# Patient Record
Sex: Female | Born: 1965 | Race: White | Hispanic: No | Smoking: Current every day smoker
Health system: Southern US, Community
[De-identification: ages and names within clinical notes are randomized; demographics above are authoritative.]

## PROBLEM LIST (undated history)

## (undated) DIAGNOSIS — R0602 Shortness of breath: Secondary | ICD-10-CM

## (undated) DIAGNOSIS — J449 Chronic obstructive pulmonary disease, unspecified: Secondary | ICD-10-CM

## (undated) DIAGNOSIS — K7460 Unspecified cirrhosis of liver: Secondary | ICD-10-CM

## (undated) DIAGNOSIS — M797 Fibromyalgia: Secondary | ICD-10-CM

## (undated) DIAGNOSIS — Z9889 Other specified postprocedural states: Secondary | ICD-10-CM

## (undated) DIAGNOSIS — B1920 Unspecified viral hepatitis C without hepatic coma: Secondary | ICD-10-CM

## (undated) DIAGNOSIS — I10 Essential (primary) hypertension: Secondary | ICD-10-CM

## (undated) DIAGNOSIS — M1990 Unspecified osteoarthritis, unspecified site: Secondary | ICD-10-CM

## (undated) DIAGNOSIS — F319 Bipolar disorder, unspecified: Secondary | ICD-10-CM

## (undated) DIAGNOSIS — F419 Anxiety disorder, unspecified: Secondary | ICD-10-CM

## (undated) DIAGNOSIS — R51 Headache: Secondary | ICD-10-CM

## (undated) HISTORY — PX: KNEE ARTHROSCOPY: SHX127

## (undated) HISTORY — PX: TONSILLECTOMY: SUR1361

## (undated) HISTORY — PX: NECK SURGERY: SHX720

## (undated) HISTORY — PX: BACK SURGERY: SHX140

---

## 2002-06-05 ENCOUNTER — Encounter: Payer: Self-pay | Admitting: Neurosurgery

## 2002-06-05 ENCOUNTER — Encounter: Admission: RE | Admit: 2002-06-05 | Discharge: 2002-06-05 | Payer: Self-pay | Admitting: Neurosurgery

## 2002-06-18 ENCOUNTER — Encounter: Payer: Self-pay | Admitting: Neurosurgery

## 2002-06-18 ENCOUNTER — Encounter: Admission: RE | Admit: 2002-06-18 | Discharge: 2002-06-18 | Payer: Self-pay | Admitting: Neurosurgery

## 2002-06-20 ENCOUNTER — Emergency Department (HOSPITAL_COMMUNITY): Admission: EM | Admit: 2002-06-20 | Discharge: 2002-06-20 | Payer: Self-pay | Admitting: Emergency Medicine

## 2002-06-25 ENCOUNTER — Ambulatory Visit (HOSPITAL_COMMUNITY): Admission: RE | Admit: 2002-06-25 | Discharge: 2002-06-25 | Payer: Self-pay | Admitting: Neurosurgery

## 2002-06-25 ENCOUNTER — Encounter: Payer: Self-pay | Admitting: Neurosurgery

## 2003-01-15 ENCOUNTER — Encounter: Admission: RE | Admit: 2003-01-15 | Discharge: 2003-01-15 | Payer: Self-pay

## 2003-05-07 ENCOUNTER — Encounter
Admission: RE | Admit: 2003-05-07 | Discharge: 2003-08-05 | Payer: Self-pay | Admitting: Physical Medicine and Rehabilitation

## 2003-11-06 ENCOUNTER — Ambulatory Visit (HOSPITAL_COMMUNITY): Admission: RE | Admit: 2003-11-06 | Discharge: 2003-11-06 | Payer: Self-pay | Admitting: *Deleted

## 2003-11-06 ENCOUNTER — Encounter (INDEPENDENT_AMBULATORY_CARE_PROVIDER_SITE_OTHER): Payer: Self-pay | Admitting: Specialist

## 2004-05-04 ENCOUNTER — Encounter: Admission: RE | Admit: 2004-05-04 | Discharge: 2004-05-04 | Payer: Self-pay | Admitting: *Deleted

## 2004-05-05 ENCOUNTER — Ambulatory Visit (HOSPITAL_COMMUNITY): Admission: RE | Admit: 2004-05-05 | Discharge: 2004-05-05 | Payer: Self-pay | Admitting: *Deleted

## 2004-05-05 ENCOUNTER — Ambulatory Visit (HOSPITAL_BASED_OUTPATIENT_CLINIC_OR_DEPARTMENT_OTHER): Admission: RE | Admit: 2004-05-05 | Discharge: 2004-05-05 | Payer: Self-pay | Admitting: *Deleted

## 2004-07-31 ENCOUNTER — Emergency Department (HOSPITAL_COMMUNITY): Admission: EM | Admit: 2004-07-31 | Discharge: 2004-07-31 | Payer: Self-pay | Admitting: Family Medicine

## 2006-01-20 ENCOUNTER — Emergency Department (HOSPITAL_COMMUNITY): Admission: EM | Admit: 2006-01-20 | Discharge: 2006-01-20 | Payer: Self-pay | Admitting: Family Medicine

## 2008-03-28 ENCOUNTER — Encounter
Admission: RE | Admit: 2008-03-28 | Discharge: 2008-03-31 | Payer: Self-pay | Admitting: Physical Medicine & Rehabilitation

## 2008-03-31 ENCOUNTER — Ambulatory Visit: Payer: Self-pay | Admitting: Physical Medicine & Rehabilitation

## 2009-10-22 ENCOUNTER — Ambulatory Visit: Payer: Self-pay | Admitting: Gastroenterology

## 2009-11-18 ENCOUNTER — Ambulatory Visit (HOSPITAL_COMMUNITY): Admission: RE | Admit: 2009-11-18 | Discharge: 2009-11-18 | Payer: Self-pay | Admitting: Gastroenterology

## 2010-01-14 ENCOUNTER — Ambulatory Visit: Payer: Self-pay | Admitting: Gastroenterology

## 2010-02-05 ENCOUNTER — Ambulatory Visit (HOSPITAL_COMMUNITY): Admission: RE | Admit: 2010-02-05 | Discharge: 2010-02-05 | Payer: Self-pay | Admitting: Gastroenterology

## 2010-02-18 ENCOUNTER — Ambulatory Visit: Payer: Self-pay | Admitting: Gastroenterology

## 2010-04-10 ENCOUNTER — Encounter: Payer: Self-pay | Admitting: Gastroenterology

## 2010-06-28 ENCOUNTER — Other Ambulatory Visit: Payer: Self-pay | Admitting: *Deleted

## 2010-06-28 NOTE — Telephone Encounter (Signed)
Pt is asking for more than #10 Phenergan refilled..   Takes more with a migraine.

## 2010-06-28 NOTE — Telephone Encounter (Signed)
Wrong chart

## 2010-06-28 NOTE — Telephone Encounter (Signed)
Per previous notes, no record of this patient being seen in this clinic.  Do we have the correct patient.  Obviously, if we have not seen cannot call in her medication.
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
## 2010-08-06 NOTE — Op Note (Signed)
NAME:  Julia [name], Julia [name] NO.:  [id number]   MEDICAL RECORD NO.:  [id number]          PATIENT TYPE:  AMB   LOCATION:  DSC                          FACILITY:  [facility]   PHYSICIAN:  Tennis Must Meyerdierks, M.D.DATE OF BIRTH:  1965-06-30   DATE OF PROCEDURE:  DATE OF DISCHARGE:                                 OPERATIVE REPORT   PREOPERATIVE DIAGNOSIS:  Lacerated radial digital nerve, right index finger,  with possible neuroma formation.   POSTOPERATIVE DIAGNOSIS:  Scarring of radial digital nerve, right index  finger.   PROCEDURE:  Neurolysis radial digital nerve, right index finger, using the  microscope surgeon,   SURGEON:  Lowell Bouton, M.D.   ANESTHESIA:  General   OPERATIVE FINDINGS:  The patient had an intact radial digital nerve and  artery. There was significant scarring surrounding it with a large fundal of  scar tissue dorsal to the neurovascular bundle.   PROCEDURE:  Under general anesthesia with a tourniquet on the right arm. The  right hand was prepped and draped in the usual fashion.  After  exsanguinating the limb, the tourniquet was inflated to 250 mmHg. A radial  based Verner incision was made across the PIP joint on the right index  finger. Sharp dissection was carried through the subcutaneous tissues and  bleeding points were coagulated.   Blunt dissection was carried radially and the radial digital nerve was  identified in the subcutaneous tissues just ulnar to the scarring. The nerve  and artery appeared to be intact under loop magnification. The microscope  was then brought in and an external epineurolysis was performed of the nerve  from proximal to distal. It appeared to be completely intact. Microscope was  then removed from the field and sharp dissection was done dorsal to the  neurovascular bundle; excising scar tissue in the subcutaneous tissues. The  wound was then irrigated with saline. Half-percent Marcaine digital lock  was  inserted for pain control. The skin was closed with 4-0 nylon sutures.  Sterile dressings were applied and the tourniquet was released with good  circulation of the hand. The patient went to recovery room awake and stable  in good condition.      EMM/MEDQ  D:  05/05/2004  T:  05/05/2004  Job:  [id number]   cc:   Lonie Peak, P.A.  [place]

## 2011-04-18 ENCOUNTER — Encounter (HOSPITAL_COMMUNITY): Payer: Self-pay | Admitting: Pharmacy Technician

## 2011-04-18 ENCOUNTER — Other Ambulatory Visit: Payer: Self-pay | Admitting: Neurosurgery

## 2011-04-19 ENCOUNTER — Encounter (HOSPITAL_COMMUNITY): Payer: Self-pay | Admitting: *Deleted

## 2011-04-19 MED ORDER — CEFAZOLIN SODIUM-DEXTROSE 2-3 GM-% IV SOLR
2.0000 g | INTRAVENOUS | Status: DC
  Filled 2011-04-19: qty 50

## 2011-04-20 ENCOUNTER — Encounter (HOSPITAL_COMMUNITY): Payer: Self-pay | Admitting: Anesthesiology

## 2011-04-20 ENCOUNTER — Other Ambulatory Visit: Payer: Self-pay

## 2011-04-20 ENCOUNTER — Ambulatory Visit (HOSPITAL_COMMUNITY): Payer: Medicaid Other | Admitting: Anesthesiology

## 2011-04-20 ENCOUNTER — Encounter (HOSPITAL_COMMUNITY)
Admission: RE | Disposition: A | Discharge status: Home / Self Care | Payer: Self-pay | Source: Ambulatory Visit | Attending: Neurosurgery

## 2011-04-20 ENCOUNTER — Ambulatory Visit (HOSPITAL_COMMUNITY): Payer: Medicaid Other

## 2011-04-20 ENCOUNTER — Ambulatory Visit (HOSPITAL_COMMUNITY)
Admission: RE | Admit: 2011-04-20 | Discharge: 2011-04-22 | Disposition: A | Discharge status: Home / Self Care | Payer: Medicaid Other | Source: Ambulatory Visit | Attending: Neurosurgery | Admitting: Neurosurgery

## 2011-04-20 ENCOUNTER — Encounter (HOSPITAL_COMMUNITY): Payer: Self-pay | Admitting: *Deleted

## 2011-04-20 DIAGNOSIS — F411 Generalized anxiety disorder: Secondary | ICD-10-CM | POA: Insufficient documentation

## 2011-04-20 DIAGNOSIS — IMO0001 Reserved for inherently not codable concepts without codable children: Secondary | ICD-10-CM | POA: Insufficient documentation

## 2011-04-20 DIAGNOSIS — E119 Type 2 diabetes mellitus without complications: Secondary | ICD-10-CM | POA: Insufficient documentation

## 2011-04-20 DIAGNOSIS — M5126 Other intervertebral disc displacement, lumbar region: Secondary | ICD-10-CM | POA: Insufficient documentation

## 2011-04-20 DIAGNOSIS — M129 Arthropathy, unspecified: Secondary | ICD-10-CM | POA: Insufficient documentation

## 2011-04-20 DIAGNOSIS — J4489 Other specified chronic obstructive pulmonary disease: Secondary | ICD-10-CM | POA: Insufficient documentation

## 2011-04-20 DIAGNOSIS — I10 Essential (primary) hypertension: Secondary | ICD-10-CM | POA: Insufficient documentation

## 2011-04-20 DIAGNOSIS — M51379 Other intervertebral disc degeneration, lumbosacral region without mention of lumbar back pain or lower extremity pain: Secondary | ICD-10-CM | POA: Insufficient documentation

## 2011-04-20 DIAGNOSIS — M5137 Other intervertebral disc degeneration, lumbosacral region: Secondary | ICD-10-CM | POA: Insufficient documentation

## 2011-04-20 DIAGNOSIS — J449 Chronic obstructive pulmonary disease, unspecified: Secondary | ICD-10-CM | POA: Insufficient documentation

## 2011-04-20 DIAGNOSIS — M5116 Intervertebral disc disorders with radiculopathy, lumbar region: Secondary | ICD-10-CM

## 2011-04-20 DIAGNOSIS — F319 Bipolar disorder, unspecified: Secondary | ICD-10-CM | POA: Insufficient documentation

## 2011-04-20 DIAGNOSIS — K7460 Unspecified cirrhosis of liver: Secondary | ICD-10-CM | POA: Insufficient documentation

## 2011-04-20 HISTORY — DX: Shortness of breath: R06.02

## 2011-04-20 HISTORY — DX: Headache: R51

## 2011-04-20 HISTORY — DX: Anxiety disorder, unspecified: F41.9

## 2011-04-20 HISTORY — DX: Unspecified cirrhosis of liver: K74.60

## 2011-04-20 HISTORY — DX: Fibromyalgia: M79.7

## 2011-04-20 HISTORY — DX: Essential (primary) hypertension: I10

## 2011-04-20 HISTORY — DX: Unspecified osteoarthritis, unspecified site: M19.90

## 2011-04-20 HISTORY — PX: LUMBAR LAMINECTOMY/DECOMPRESSION MICRODISCECTOMY: SHX5026

## 2011-04-20 HISTORY — DX: Chronic obstructive pulmonary disease, unspecified: J44.9

## 2011-04-20 LAB — CBC
HCT: 43.5 % (ref 36.0–46.0)
Hemoglobin: 15 g/dL (ref 12.0–15.0)
WBC: 11.7 10*3/uL — ABNORMAL HIGH (ref 4.0–10.5)

## 2011-04-20 LAB — GLUCOSE, CAPILLARY
Glucose-Capillary: 80 mg/dL (ref 70–99)
Glucose-Capillary: 111 mg/dL — ABNORMAL HIGH (ref 70–99)

## 2011-04-20 LAB — COMPREHENSIVE METABOLIC PANEL
BUN: 11 mg/dL (ref 6–23)
Calcium: 9.9 mg/dL (ref 8.4–10.5)
Creatinine, Ser: 0.58 mg/dL (ref 0.50–1.10)
GFR calc Af Amer: >90 mL/min (ref >90)
Glucose, Bld: 56 mg/dL — ABNORMAL LOW (ref 70–99)
Total Protein: 6.6 g/dL (ref 6.0–8.3)

## 2011-04-20 LAB — SURGICAL PCR SCREEN
MRSA, PCR: NEGATIVE
Staphylococcus aureus: NEGATIVE

## 2011-04-20 SURGERY — LUMBAR LAMINECTOMY/DECOMPRESSION MICRODISCECTOMY
Anesthesia: General | Site: Back | Laterality: Right | Wound class: Clean

## 2011-04-20 MED ORDER — RISPERIDONE 1 MG PO TABS: 1.0000 mg | ORAL_TABLET | Freq: Two times a day (BID) | ORAL | Status: DC

## 2011-04-20 MED ORDER — RISPERIDONE 1 MG PO TABS
1.0000 mg | ORAL_TABLET | ORAL | Status: DC
  Administered 2011-04-21 – 2011-04-22 (×2): 1 mg via ORAL
  Filled 2011-04-20 (×3): qty 1

## 2011-04-20 MED ORDER — RISPERIDONE 2 MG PO TABS
2.0000 mg | ORAL_TABLET | Freq: Every evening | ORAL | Status: DC
  Administered 2011-04-20 – 2011-04-21 (×2): 2 mg via ORAL
  Filled 2011-04-20 (×3): qty 1

## 2011-04-20 MED ORDER — FENTANYL CITRATE 0.05 MG/ML IJ SOLN
INTRAMUSCULAR | Status: DC | PRN
  Administered 2011-04-20: 100 ug via INTRAVENOUS

## 2011-04-20 MED ORDER — HEMOSTATIC AGENTS (NO CHARGE) OPTIME
TOPICAL | Status: DC | PRN
  Administered 2011-04-20: 1 via TOPICAL

## 2011-04-20 MED ORDER — INSULIN ASPART PROT & ASPART (70-30 MIX) 100 UNIT/ML ~~LOC~~ SUSP
50.0000 [IU] | Freq: Every day | SUBCUTANEOUS | Status: DC
  Administered 2011-04-21 – 2011-04-22 (×2): 50 [IU] via SUBCUTANEOUS
  Filled 2011-04-20: qty 3

## 2011-04-20 MED ORDER — HYDROMORPHONE HCL PF 1 MG/ML IJ SOLN
INTRAMUSCULAR | Status: AC
  Filled 2011-04-20: qty 1

## 2011-04-20 MED ORDER — LIDOCAINE HCL (CARDIAC) 20 MG/ML IV SOLN
INTRAVENOUS | Status: DC | PRN
  Administered 2011-04-20: 80 mg via INTRAVENOUS

## 2011-04-20 MED ORDER — DOCUSATE SODIUM 100 MG PO CAPS
100.0000 mg | ORAL_CAPSULE | Freq: Two times a day (BID) | ORAL | Status: DC
  Administered 2011-04-20 – 2011-04-22 (×4): 100 mg via ORAL
  Filled 2011-04-20 (×4): qty 1

## 2011-04-20 MED ORDER — CEFAZOLIN SODIUM 1-5 GM-% IV SOLN
1.0000 g | Freq: Three times a day (TID) | INTRAVENOUS | Status: AC
  Administered 2011-04-20 – 2011-04-21 (×2): 1 g via INTRAVENOUS
  Filled 2011-04-20 (×2): qty 50

## 2011-04-20 MED ORDER — PHENYLEPHRINE HCL 10 MG/ML IJ SOLN
INTRAMUSCULAR | Status: DC | PRN
  Administered 2011-04-20 (×4): 80 ug via INTRAVENOUS

## 2011-04-20 MED ORDER — HYDROMORPHONE HCL PF 1 MG/ML IJ SOLN
0.2500 mg | INTRAMUSCULAR | Status: DC | PRN
  Administered 2011-04-20 (×4): 0.5 mg via INTRAVENOUS

## 2011-04-20 MED ORDER — THROMBIN 5000 UNITS EX SOLR
CUTANEOUS | Status: DC | PRN
  Administered 2011-04-20 (×2): 5000 [IU] via TOPICAL

## 2011-04-20 MED ORDER — 0.9 % SODIUM CHLORIDE (POUR BTL) OPTIME
TOPICAL | Status: DC | PRN
  Administered 2011-04-20: 1000 mL

## 2011-04-20 MED ORDER — MENTHOL 3 MG MT LOZG: 1.0000 | LOZENGE | OROMUCOSAL | Status: DC | PRN

## 2011-04-20 MED ORDER — LISINOPRIL 10 MG PO TABS
10.0000 mg | ORAL_TABLET | Freq: Every day | ORAL | Status: DC
  Administered 2011-04-21 – 2011-04-22 (×2): 10 mg via ORAL
  Filled 2011-04-20 (×3): qty 1

## 2011-04-20 MED ORDER — LACTATED RINGERS IV SOLN
INTRAVENOUS | Status: DC | PRN
  Administered 2011-04-20: 17:00:00 via INTRAVENOUS

## 2011-04-20 MED ORDER — METHYLPREDNISOLONE ACETATE 80 MG/ML IJ SUSP
INTRAMUSCULAR | Status: DC | PRN
  Administered 2011-04-20: 80 mg

## 2011-04-20 MED ORDER — ONDANSETRON HCL 4 MG/2ML IJ SOLN: 4.0000 mg | Freq: Four times a day (QID) | INTRAMUSCULAR | Status: DC | PRN

## 2011-04-20 MED ORDER — HYDROCHLOROTHIAZIDE 12.5 MG PO CAPS
12.5000 mg | ORAL_CAPSULE | Freq: Every day | ORAL | Status: DC
  Administered 2011-04-21 – 2011-04-22 (×2): 12.5 mg via ORAL
  Filled 2011-04-20 (×3): qty 1

## 2011-04-20 MED ORDER — GLYCOPYRROLATE 0.2 MG/ML IJ SOLN
INTRAMUSCULAR | Status: DC | PRN
  Administered 2011-04-20: .6 mg via INTRAVENOUS

## 2011-04-20 MED ORDER — MUPIROCIN 2 % EX OINT
TOPICAL_OINTMENT | CUTANEOUS | Status: AC
  Filled 2011-04-20: qty 22

## 2011-04-20 MED ORDER — ZOLPIDEM TARTRATE 10 MG PO TABS
10.0000 mg | ORAL_TABLET | Freq: Every evening | ORAL | Status: DC | PRN
  Administered 2011-04-20 – 2011-04-21 (×2): 10 mg via ORAL
  Filled 2011-04-20 (×2): qty 1

## 2011-04-20 MED ORDER — SODIUM CHLORIDE 0.9 % IJ SOLN: 3.0000 mL | INTRAMUSCULAR | Status: DC | PRN

## 2011-04-20 MED ORDER — TIOTROPIUM BROMIDE MONOHYDRATE 18 MCG IN CAPS
18.0000 ug | ORAL_CAPSULE | Freq: Every day | RESPIRATORY_TRACT | Status: DC
  Administered 2011-04-21 – 2011-04-22 (×2): 18 ug via RESPIRATORY_TRACT
  Filled 2011-04-20: qty 5

## 2011-04-20 MED ORDER — MORPHINE SULFATE 4 MG/ML IJ SOLN
4.0000 mg | INTRAMUSCULAR | Status: DC | PRN
  Administered 2011-04-20 – 2011-04-22 (×7): 4 mg via INTRAVENOUS
  Filled 2011-04-20 (×7): qty 1

## 2011-04-20 MED ORDER — ROCURONIUM BROMIDE 100 MG/10ML IV SOLN
INTRAVENOUS | Status: DC | PRN
  Administered 2011-04-20: 50 mg via INTRAVENOUS

## 2011-04-20 MED ORDER — FENTANYL CITRATE 0.05 MG/ML IJ SOLN
INTRAMUSCULAR | Status: AC
  Filled 2011-04-20: qty 2

## 2011-04-20 MED ORDER — SODIUM CHLORIDE 0.9 % IV SOLN
INTRAVENOUS | Status: DC | PRN
  Administered 2011-04-20: 16:00:00 via INTRAVENOUS

## 2011-04-20 MED ORDER — PHENOL 1.4 % MT LIQD: 1.0000 | OROMUCOSAL | Status: DC | PRN

## 2011-04-20 MED ORDER — ONDANSETRON HCL 4 MG/2ML IJ SOLN
INTRAMUSCULAR | Status: DC | PRN
  Administered 2011-04-20: 4 mg via INTRAVENOUS

## 2011-04-20 MED ORDER — OXYCODONE-ACETAMINOPHEN 5-325 MG PO TABS
1.0000 | ORAL_TABLET | ORAL | Status: DC | PRN
  Administered 2011-04-20 – 2011-04-22 (×9): 2 via ORAL
  Filled 2011-04-20 (×9): qty 2

## 2011-04-20 MED ORDER — SODIUM CHLORIDE 0.9 % IJ SOLN
3.0000 mL | Freq: Two times a day (BID) | INTRAMUSCULAR | Status: DC
  Administered 2011-04-21 (×2): 3 mL via INTRAVENOUS

## 2011-04-20 MED ORDER — AMLODIPINE BESYLATE 10 MG PO TABS
10.0000 mg | ORAL_TABLET | Freq: Every day | ORAL | Status: DC
  Administered 2011-04-21 – 2011-04-22 (×2): 10 mg via ORAL
  Filled 2011-04-20 (×3): qty 1

## 2011-04-20 MED ORDER — SODIUM CHLORIDE 0.9 % IV SOLN: 250.0000 mL | INTRAVENOUS | Status: DC

## 2011-04-20 MED ORDER — SODIUM CHLORIDE 0.9 % IV SOLN
INTRAVENOUS | Status: DC
  Administered 2011-04-20 (×2): via INTRAVENOUS

## 2011-04-20 MED ORDER — PROPOFOL 10 MG/ML IV EMUL
INTRAVENOUS | Status: DC | PRN
  Administered 2011-04-20: 170 mg via INTRAVENOUS

## 2011-04-20 MED ORDER — INSULIN NPH ISOPHANE & REGULAR (70-30) 100 UNIT/ML ~~LOC~~ SUSP: 30.0000 [IU] | Freq: Two times a day (BID) | SUBCUTANEOUS | Status: DC

## 2011-04-20 MED ORDER — BUPIVACAINE-EPINEPHRINE PF 0.5-1:200000 % IJ SOLN
INTRAMUSCULAR | Status: DC | PRN
  Administered 2011-04-20: 20 mL

## 2011-04-20 MED ORDER — EPHEDRINE SULFATE 50 MG/ML IJ SOLN
INTRAMUSCULAR | Status: DC | PRN
  Administered 2011-04-20: 15 mg via INTRAVENOUS
  Administered 2011-04-20 (×2): 5 mg via INTRAVENOUS
  Administered 2011-04-20 (×2): 10 mg via INTRAVENOUS

## 2011-04-20 MED ORDER — DEXTROSE 50 % IV SOLN
INTRAVENOUS | Status: AC
  Administered 2011-04-20: 25 mL via INTRAVENOUS
  Filled 2011-04-20: qty 50

## 2011-04-20 MED ORDER — ONDANSETRON HCL 4 MG/2ML IJ SOLN
4.0000 mg | INTRAMUSCULAR | Status: DC | PRN
Start: 2011-04-20 — End: 2011-04-22

## 2011-04-20 MED ORDER — ACETAMINOPHEN 650 MG RE SUPP: 650.0000 mg | RECTAL | Status: DC | PRN

## 2011-04-20 MED ORDER — DEXTROSE 50 % IV SOLN
INTRAVENOUS | Status: DC | PRN
  Administered 2011-04-20: 12.5 g via INTRAVENOUS

## 2011-04-20 MED ORDER — INSULIN ASPART PROT & ASPART (70-30 MIX) 100 UNIT/ML ~~LOC~~ SUSP
30.0000 [IU] | Freq: Every day | SUBCUTANEOUS | Status: DC
  Administered 2011-04-21: 30 [IU] via SUBCUTANEOUS
  Filled 2011-04-20: qty 3

## 2011-04-20 MED ORDER — NEOSTIGMINE METHYLSULFATE 1 MG/ML IJ SOLN
INTRAMUSCULAR | Status: DC | PRN
  Administered 2011-04-20: 4 mg via INTRAVENOUS

## 2011-04-20 MED ORDER — LISINOPRIL-HYDROCHLOROTHIAZIDE 10-12.5 MG PO TABS: 1.0000 | ORAL_TABLET | Freq: Every day | ORAL | Status: DC

## 2011-04-20 MED ORDER — ACETAMINOPHEN 325 MG PO TABS: 650.0000 mg | ORAL_TABLET | ORAL | Status: DC | PRN

## 2011-04-20 SURGICAL SUPPLY — 59 items
APL SKNCLS STERI-STRIP NONHPOA (GAUZE/BANDAGES/DRESSINGS) ×1
BENZOIN TINCTURE PRP APPL 2/3 (GAUZE/BANDAGES/DRESSINGS) ×2 IMPLANT
BLADE SURG ROTATE 9660 (MISCELLANEOUS) IMPLANT
BUR ACORN 6.0 (BURR) IMPLANT
BUR MATCHSTICK NEURO 3.0 LAGG (BURR) ×2 IMPLANT
CANISTER SUCTION 2500CC (MISCELLANEOUS) ×2 IMPLANT
CLOSURE STERI STRIP 1/2 X4 (GAUZE/BANDAGES/DRESSINGS) ×1 IMPLANT
CLOTH BEACON ORANGE TIMEOUT ST (SAFETY) ×2 IMPLANT
CONT SPEC 4OZ CLIKSEAL STRL BL (MISCELLANEOUS) ×2 IMPLANT
DRAPE LAPAROTOMY 100X72X124 (DRAPES) ×2 IMPLANT
DRAPE MICROSCOPE LEICA (MISCELLANEOUS) ×2 IMPLANT
DRAPE POUCH INSTRU U-SHP 10X18 (DRAPES) ×2 IMPLANT
DRSG PAD ABDOMINAL 8X10 ST (GAUZE/BANDAGES/DRESSINGS) IMPLANT
DURAPREP 26ML APPLICATOR (WOUND CARE) ×2 IMPLANT
ELECT REM PT RETURN 9FT ADLT (ELECTROSURGICAL) ×2
ELECTRODE REM PT RTRN 9FT ADLT (ELECTROSURGICAL) ×1 IMPLANT
GAUZE SPONGE 4X4 16PLY XRAY LF (GAUZE/BANDAGES/DRESSINGS) IMPLANT
GLOVE BIOGEL M 8.0 STRL (GLOVE) ×2 IMPLANT
GLOVE ECLIPSE 7.5 STRL STRAW (GLOVE) ×2 IMPLANT
GLOVE EXAM NITRILE LRG STRL (GLOVE) IMPLANT
GLOVE EXAM NITRILE MD LF STRL (GLOVE) IMPLANT
GLOVE EXAM NITRILE XL STR (GLOVE) IMPLANT
GLOVE EXAM NITRILE XS STR PU (GLOVE) IMPLANT
GLOVE INDICATOR 7.0 STRL GRN (GLOVE) ×1 IMPLANT
GLOVE INDICATOR 7.5 STRL GRN (GLOVE) ×1 IMPLANT
GLOVE OPTIFIT SS 6.5 STRL BRWN (GLOVE) ×1 IMPLANT
GOWN BRE IMP SLV AUR LG STRL (GOWN DISPOSABLE) ×2 IMPLANT
GOWN BRE IMP SLV AUR XL STRL (GOWN DISPOSABLE) ×2 IMPLANT
GOWN STRL REIN 2XL LVL4 (GOWN DISPOSABLE) ×2 IMPLANT
KIT BASIN OR (CUSTOM PROCEDURE TRAY) ×2 IMPLANT
KIT ROOM TURNOVER OR (KITS) ×2 IMPLANT
NDL HYPO 18GX1.5 BLUNT FILL (NEEDLE) IMPLANT
NDL HYPO 21X1.5 SAFETY (NEEDLE) IMPLANT
NDL HYPO 25X1 1.5 SAFETY (NEEDLE) IMPLANT
NDL SPNL 20GX3.5 QUINCKE YW (NEEDLE) IMPLANT
NEEDLE HYPO 18GX1.5 BLUNT FILL (NEEDLE) IMPLANT
NEEDLE HYPO 21X1.5 SAFETY (NEEDLE) IMPLANT
NEEDLE HYPO 25X1 1.5 SAFETY (NEEDLE) IMPLANT
NEEDLE SPNL 20GX3.5 QUINCKE YW (NEEDLE) IMPLANT
NS IRRIG 1000ML POUR BTL (IV SOLUTION) ×2 IMPLANT
PACK LAMINECTOMY NEURO (CUSTOM PROCEDURE TRAY) ×2 IMPLANT
PAD ARMBOARD 7.5X6 YLW CONV (MISCELLANEOUS) ×6 IMPLANT
PATTIES SURGICAL .5 X1 (DISPOSABLE) ×2 IMPLANT
RUBBERBAND STERILE (MISCELLANEOUS) ×4 IMPLANT
SPONGE GAUZE 4X4 12PLY (GAUZE/BANDAGES/DRESSINGS) ×2 IMPLANT
SPONGE LAP 4X18 X RAY DECT (DISPOSABLE) IMPLANT
SPONGE SURGIFOAM ABS GEL SZ50 (HEMOSTASIS) ×2 IMPLANT
STRIP CLOSURE SKIN 1/2X4 (GAUZE/BANDAGES/DRESSINGS) ×2 IMPLANT
SUT VIC AB 0 CT1 18XCR BRD8 (SUTURE) ×1 IMPLANT
SUT VIC AB 0 CT1 8-18 (SUTURE) ×2
SUT VIC AB 2-0 CP2 18 (SUTURE) ×2 IMPLANT
SUT VIC AB 3-0 SH 8-18 (SUTURE) ×2 IMPLANT
SYR 20CC LL (SYRINGE) IMPLANT
SYR 20ML ECCENTRIC (SYRINGE) ×2 IMPLANT
SYR 5ML LL (SYRINGE) IMPLANT
TAPE CLOTH SURG 4X10 WHT LF (GAUZE/BANDAGES/DRESSINGS) ×1 IMPLANT
TOWEL OR 17X24 6PK STRL BLUE (TOWEL DISPOSABLE) ×2 IMPLANT
TOWEL OR 17X26 10 PK STRL BLUE (TOWEL DISPOSABLE) ×2 IMPLANT
WATER STERILE IRR 1000ML POUR (IV SOLUTION) ×2 IMPLANT

## 2011-04-20 NOTE — Progress Notes (Signed)
Op note [id number]

## 2011-04-20 NOTE — Progress Notes (Signed)
Right l4 5 discectomy

## 2011-04-20 NOTE — Anesthesia Postprocedure Evaluation (Signed)
  Anesthesia Post-op Note  Patient: Julia [name]  Procedure(s) Performed:  LUMBAR LAMINECTOMY/DECOMPRESSION MICRODISCECTOMY - Right Lumbar four- five Diskectomy  Patient Location: PACU  Anesthesia Type: General  Level of Consciousness: awake, alert  and oriented  Airway and Oxygen Therapy: Patient Spontanous Breathing and Patient connected to nasal cannula oxygen  Post-op Pain: mild  Post-op Assessment: Post-op Vital signs reviewed, Patient's Cardiovascular Status Stable, Respiratory Function Stable, Patent Airway, No signs of Nausea or vomiting and Pain level controlled  Post-op Vital Signs: Reviewed and stable  Complications: No apparent anesthesia complications

## 2011-04-20 NOTE — Preoperative (Signed)
Beta Blockers   Reason not to administer Beta Blockers:Not Applicable 

## 2011-04-20 NOTE — H&P (Signed)
Julia [name] is an 46 y.o. female.   Chief Complaint: right leg pain HPI: 7 months history of lbp with right leg pain,Also, . Some neck and thoracic pain. She has failed with conservative  treatment. Send to [place] by neurologist.  Past Medical History  Diagnosis Date  . Hypertension   . Diabetes mellitus   . COPD (chronic obstructive pulmonary disease)   . Asthma   . Shortness of breath   . Headache   . Arthritis   . Bipolar affective disorder   . Fibromyalgia   . Cirrhosis of liver   . Anxiety     Past Surgical History  Procedure Date  . Neck surgery   . Cesarean section   . Tonsillectomy   . Back surgery     x2  . Knee arthroscopy     L    History reviewed. No pertinent family history. Social History:  reports that she has been smoking.  She does not have any smokeless tobacco history on file. She reports that she does not drink alcohol or use illicit drugs.  Allergies:  Allergies  Allergen Reactions  . Codeine Hives  . Gabapentin Rash    Medications Prior to Admission  Medication Dose Route Frequency Provider Last Rate Last Dose  . ceFAZolin (ANCEF) IVPB 2 g/50 mL premix  2 g Intravenous 60 min Pre-Op Karn Cassis, MD      . dextrose 50 % solution        25 mL at 04/20/11 1358  . mupirocin ointment (BACTROBAN) 2 %            No current outpatient prescriptions on file as of 04/20/2011.    Results for orders placed during the hospital encounter of 04/20/11 (from the past 48 hour(s))  GLUCOSE, CAPILLARY     Status: Normal   Collection Time   04/20/11 12:39 PM      Component Value Range Comment   Glucose-Capillary 80  70 - 99 (mg/dL)   COMPREHENSIVE METABOLIC PANEL     Status: Abnormal   Collection Time   04/20/11  1:15 PM      Component Value Range Comment   Sodium 136  135 - 145 (mEq/L)    Potassium 3.3 (*) 3.5 - 5.1 (mEq/L)    Chloride 99  96 - 112 (mEq/L)    CO2 28  19 - 32 (mEq/L)    Glucose, Bld 56 (*) 70 - 99 (mg/dL)    BUN 11  6 - 23 (mg/dL)    Creatinine, Ser 0.86  0.50 - 1.10 (mg/dL)    Calcium 9.9  8.4 - 10.5 (mg/dL)    Total Protein 6.6  6.0 - 8.3 (g/dL)    Albumin 3.6  3.5 - 5.2 (g/dL)    AST 46 (*) 0 - 37 (U/L)    ALT 42 (*) 0 - 35 (U/L)    Alkaline Phosphatase 55  39 - 117 (U/L)    Total Bilirubin 0.6  0.3 - 1.2 (mg/dL)    GFR calc non Af Amer >90  >90 (mL/min)    GFR calc Af Amer >90  >90 (mL/min)   CBC     Status: Abnormal   Collection Time   04/20/11  1:15 PM      Component Value Range Comment   WBC 11.7 (*) 4.0 - 10.5 (K/uL)    RBC 4.69  3.87 - 5.11 (MIL/uL)    Hemoglobin 15.0  12.0 - 15.0 (g/dL)    HCT  43.5  36.0 - 46.0 (%)    MCV 92.8  78.0 - 100.0 (fL)    MCH 32.0  26.0 - 34.0 (pg)    MCHC 34.5  30.0 - 36.0 (g/dL)    RDW 45.4  09.8 - 11.9 (%)    Platelets 122 (*) 150 - 400 (K/uL)   HCG, SERUM, QUALITATIVE     Status: Normal   Collection Time   04/20/11  1:15 PM      Component Value Range Comment   Preg, Serum NEGATIVE  NEGATIVE    GLUCOSE, CAPILLARY     Status: Abnormal   Collection Time   04/20/11  1:32 PM      Component Value Range Comment   Glucose-Capillary 60 (*) 70 - 99 (mg/dL)    Comment 1 Notify RN     GLUCOSE, CAPILLARY     Status: Abnormal   Collection Time   04/20/11  2:15 PM      Component Value Range Comment   Glucose-Capillary 111 (*) 70 - 99 (mg/dL)    Dg Chest 2 View  1/47/8295  *RADIOLOGY REPORT*  Clinical Data: Preoperative evaluation for lumbar disc surgery, hypertension, diabetes  CHEST - 2 VIEW  Comparison:  02/05/2010  Findings:  The heart size and mediastinal contours are within normal limits.  Both lungs are clear.  The visualized skeletal structures are unremarkable.  IMPRESSION: No active cardiopulmonary disease.  Original Report Authenticated By: Judie Petit. Ruel Favors, M.D.    Review of Systems  HENT: Negative.   Eyes: Negative.   Respiratory:       Emphysema  Cardiovascular: Negative.   Gastrointestinal: Negative.        Liver disease  Genitourinary: Negative.         Arterial hypertension  Musculoskeletal: Positive for back pain.  Skin: Negative.   Neurological: Negative.   Endo/Heme/Allergies: Negative.   Psychiatric/Behavioral: Positive for depression.    Blood pressure 105/79, pulse 103, temperature 97.9 F (36.6 C), temperature source Oral, resp. rate 20, height 5\' 5"  (1.651 m), weight 98.431 kg (217 lb), last menstrual period 04/18/2011, SpO2 95.00%. Physical Exam hent nl. Neck ,flexible. Cv, nl. Lungs mild ronchii bilaterally. Abdomen, nl. Extremities, grade 1 edema. NEUROweakness of df right foot. Sensory,nl. slr positive in right at 30 degrees.scar lumbar area from previous surgery x 2. Mri ddd. Large right hnp at right l4 5.  Assessment/Plan right l4 5 discectomy.   Keldon Lassen M 04/20/2011, 3:17 PM

## 2011-04-20 NOTE — Anesthesia Procedure Notes (Signed)
Procedure Name: Intubation Date/Time: 04/20/2011 4:12 PM Performed by: Shirlyn Goltz, TOM Pre-anesthesia Checklist: Patient identified, Emergency Drugs available, Suction available, Patient being monitored and Timeout performed Patient Re-evaluated:Patient Re-evaluated prior to inductionOxygen Delivery Method: Circle System Utilized Preoxygenation: Pre-oxygenation with 100% oxygen Intubation Type: IV induction Ventilation: Mask ventilation without difficulty and Oral airway inserted - appropriate to patient size Laryngoscope Size: Mac and 4 Grade View: Grade I Tube type: Oral Tube size: 7.5 mm Number of attempts: 1 Airway Equipment and Method: stylet Placement Confirmation: ETT inserted through vocal cords under direct vision,  positive ETCO2 and breath sounds checked- equal and bilateral Secured at: 21 cm Tube secured with: Tape Dental Injury: Teeth and Oropharynx as per pre-operative assessment

## 2011-04-20 NOTE — Anesthesia Preprocedure Evaluation (Addendum)
Anesthesia Evaluation  Patient identified by MRN, date of birth, ID band Patient awake    Reviewed: Allergy & Precautions, H&P , NPO status , Patient's Chart, lab work & pertinent test results  Airway Mallampati: II TM Distance: >3 FB Neck ROM: full    Dental  (+) Edentulous Upper and Edentulous Lower   Pulmonary shortness of breath, asthma , COPD COPD inhaler, Current Smoker,          Cardiovascular hypertension, Pt. on medications     Neuro/Psych  Headaches, PSYCHIATRIC DISORDERS Anxiety  Neuromuscular disease    GI/Hepatic   Endo/Other  Diabetes mellitus-  Renal/GU      Musculoskeletal  (+) Fibromyalgia -  Abdominal   Peds  Hematology   Anesthesia Other Findings   Reproductive/Obstetrics                          Anesthesia Physical Anesthesia Plan  ASA: III  Anesthesia Plan: General   Post-op Pain Management:    Induction: Intravenous  Airway Management Planned: Oral ETT  Additional Equipment:   Intra-op Plan:   Post-operative Plan: Extubation in OR  Informed Consent: I have reviewed the patients History and Physical, chart, labs and discussed the procedure including the risks, benefits and alternatives for the proposed anesthesia with the patient or authorized representative who has indicated his/her understanding and acceptance.     Plan Discussed with: CRNA, Surgeon and Anesthesiologist  Anesthesia Plan Comments:        Anesthesia Quick Evaluation

## 2011-04-20 NOTE — Transfer of Care (Signed)
Immediate Anesthesia Transfer of Care Note  Patient: Julia [name]  Procedure(s) Performed:  LUMBAR LAMINECTOMY/DECOMPRESSION MICRODISCECTOMY - Right Lumbar four- five Diskectomy  Patient Location: PACU  Anesthesia Type: General  Level of Consciousness: awake, alert , oriented and patient cooperative  Airway & Oxygen Therapy: Patient Spontanous Breathing and Patient connected to nasal cannula oxygen  Post-op Assessment: Report given to PACU RN, Post -op Vital signs reviewed and stable and Patient moving all extremities X 4  Post vital signs: Reviewed and stable  Complications: No apparent anesthesia complications

## 2011-04-21 LAB — GLUCOSE, CAPILLARY
Glucose-Capillary: 165 mg/dL — ABNORMAL HIGH (ref 70–99)
Glucose-Capillary: 165 mg/dL — ABNORMAL HIGH (ref 70–99)

## 2011-04-21 MED ORDER — PNEUMOCOCCAL VAC POLYVALENT 25 MCG/0.5ML IJ INJ
0.5000 mL | INJECTION | INTRAMUSCULAR | Status: AC
  Administered 2011-04-22: 0.5 mL via INTRAMUSCULAR
  Filled 2011-04-21: qty 0.5

## 2011-04-21 NOTE — Progress Notes (Signed)
Physical Therapy Evaluation Patient Details Name: Julia [name] MRN: [id number] DOB: 1965/07/17 Today's Date: 04/21/2011  Problem List: There is no problem list on file for this patient.   Past Medical History:  Past Medical History  Diagnosis Date  . Hypertension   . Diabetes mellitus   . COPD (chronic obstructive pulmonary disease)   . Asthma   . Shortness of breath   . Headache   . Arthritis   . Bipolar affective disorder   . Fibromyalgia   . Cirrhosis of liver   . Anxiety    Past Surgical History:  Past Surgical History  Procedure Date  . Neck surgery   . Cesarean section   . Tonsillectomy   . Back surgery     x2  . Knee arthroscopy     L    PT Assessment/Plan/Recommendation PT Assessment Clinical Impression Statement: Pt presents with a medical diagnosis of Right Lumbar four- five Discectomy along with the following impairments/deficits and therapy diagnosis lsited below. Pt will  benefit from skilled PT in the acute care setting in order to maximize functional mobility for a safe d/c home PT Recommendation/Assessment: Patient will need skilled PT in the acute care venue PT Problem List: Decreased activity tolerance;Decreased mobility;Decreased knowledge of precautions;Pain PT Therapy Diagnosis : Acute pain PT Plan PT Frequency: Min 5X/week PT Treatment/Interventions: DME instruction;Gait training;Stair training;Functional mobility training;Therapeutic exercise;Therapeutic activities;Patient/family education PT Recommendation Follow Up Recommendations: No PT follow up;Supervision - Intermittent Equipment Recommended: None recommended by PT PT Goals  Acute Rehab PT Goals PT Goal Formulation: With patient Time For Goal Achievement: 7 days Pt will go Supine/Side to Sit: with modified independence PT Goal: Supine/Side to Sit - Progress: Goal set today Pt will go Sit to Stand: with modified independence PT Goal: Sit to Stand - Progress: Goal set today Pt will go  Stand to Sit: with modified independence PT Goal: Stand to Sit - Progress: Goal set today Pt will Transfer Bed to Chair/Chair to Bed: with modified independence PT Transfer Goal: Bed to Chair/Chair to Bed - Progress: Goal set today Pt will Ambulate: >150 feet;with supervision;with least restrictive assistive device PT Goal: Ambulate - Progress: Goal set today Pt will Go Up / Down Stairs: 3-5 stairs;with min assist;with least restrictive assistive device PT Goal: Up/Down Stairs - Progress: Goal set today  PT Evaluation Precautions/Restrictions  Precautions Precautions: Back Precaution Booklet Issued: Yes (comment) Prior Functioning  Home Living Lives With: Son;Other (Comment) (going home with mother) Receives Help From: Family (staying at [employer]) Type of Home: House Home Layout: One level Home Access: Stairs to enter Entrance Stairs-Rails: None Entrance Stairs-Number of Steps: 3 (back door has rails and 5 steps) Bathroom Shower/Tub: Forensic scientist: Standard Bathroom Accessibility: Yes How Accessible: Accessible via walker Home Adaptive Equipment: Straight cane Prior Function Level of Independence: Independent with basic ADLs;Independent with homemaking with ambulation;Independent with gait;Independent with transfers Able to Take Stairs?: Yes Driving: No Vocation: Unemployed Financial risk analyst Arousal/Alertness: Awake/alert Overall Cognitive Status: Appears within functional limits for tasks assessed Orientation Level: Oriented X4 Sensation/Coordination Sensation Light Touch: Appears Intact Extremity Assessment RLE Assessment RLE Assessment: Within Functional Limits LLE Assessment LLE Assessment: Within Functional Limits Mobility (including Balance) Bed Mobility Bed Mobility: Yes Rolling Left: 4: Min assist Rolling Left Details (indicate cue type and reason): VC for technique to maintain back precautions.  Left Sidelying to Sit: 3: Mod  assist;With rails Left Sidelying to Sit Details (indicate cue type and reason): VC for sequencing and technique.  Tactile cues through hip for trunk facilitation into sitting Sitting - Scoot to Edge of Bed: 5: Supervision Sitting - Scoot to [place] of Bed Details (indicate cue type and reason): VC for weight shifting and hand placement Transfers Transfers: Yes Sit to Stand: 4: Min assist;With upper extremity assist;From bed;From chair/3-in-1 Sit to Stand Details (indicate cue type and reason): VC for hand placement for safety to RW Stand to Sit: 4: Min assist;With upper extremity assist;To chair/3-in-1 Stand to Sit Details: VC for hand placement as well as cues to maintain back precautions with sitting Ambulation/Gait Ambulation/Gait: Yes Ambulation/Gait Assistance: 4: Min assist Ambulation/Gait Assistance Details (indicate cue type and reason): VC for technique during turning to maintain back precautions. Min assist for safety  Ambulation Distance (Feet): 15 Feet (to/from bathroom) Assistive device: None Gait Pattern: Step-to pattern;Decreased stride length Gait velocity: Decreased gait speed Stairs: No  Balance Balance Assessed: Yes Static Standing Balance Static Standing - Balance Support: Left upper extremity supported;During functional activity (at sink) Static Standing - Level of Assistance: 5: Stand by assistance Static Standing - Comment/# of Minutes: 5 minutes while brushing teeth. SBA for safety secondary to fatigue Exercise    End of Session PT - End of Session Equipment Utilized During Treatment: Gait belt Activity Tolerance: Patient limited by fatigue Patient left: in chair;with call bell in reach;with family/visitor present Nurse Communication: Mobility status for transfers;Mobility status for ambulation General Behavior During Session: [facility] for tasks performed Cognition: [facility] for tasks performed  Milana Kidney 04/21/2011, 10:21 AM  04/21/2011 Milana Kidney  DPT PAGER: [phone] OFFICE: [phone]

## 2011-04-21 NOTE — Progress Notes (Signed)
Patient ID: Julia [name], female   DOB: 1965/06/18, 46 y.o.   MRN: [id number] Stable. C/o incisional pain. No right leg as preop.

## 2011-04-21 NOTE — Op Note (Signed)
Julia [name], Julia [name] NO.:  [id number]  MEDICAL RECORD NO.:  [id number]  LOCATION:  3016                         FACILITY:  [facility]  PHYSICIAN:  Hilda Lias, M.D.   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  Diabetes mellitus.  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 Diabetes mellitus.  PROCEDURE:  Right L4-5 diskectomy, decompression of the L4 and L5 nerve root and the thecal sac.  Microscope.  SURGEON:  Hilda Lias, MD  ASSISTANT:  Coletta Memos, MD  CLINICAL HISTORY:  [name] is a lady who came to my office complaining of back pain with radiation to the right leg.  The patient had 2 previous back surgeries, but she does not know who did it.  She has failed conservative treatment.  The MRI done several months ago showed recurrent disk at L4-5 with degenerative disk disease at L5-1.  She had failed all conservative treatment.  Surgery was advised.  PROCEDURE:  The patient was taken to the OR and after intubation, she was positioned in a prone manner.  The back was cleaned with DuraPrep. A midline incision following the previous one was made through the skin, subcutaneous tissue straight down to the lumbar spine.  X-rays showed that we were at the level of L4-5.  We found quite a bit of degenerative changes.  We had to drill the lower lamina of L4 and the upper of L5 and remove part of the thick yellow ligament to be able to get into the thecal sac.  Retraction was done, and there was a large herniated disk central to the right.  Incision was made and large amount of degenerative disk disease were removed  medially and laterally.  At the end, we had plenty of space for the L4-L5 nerve root as well as the thecal sac.  The area was irrigated.  Fentanyl and Depo-Medrol were left in the epidural space, and the wound was closed with Vicryl and Steri- Strips.          ______________________________ Hilda Lias, M.D.     EB/MEDQ  D:  04/20/2011  T:  04/21/2011  Job:  [id number]

## 2011-04-21 NOTE — Progress Notes (Signed)
Clinical Child psychotherapist received consult for "SNF." CSW reviewed chart and consulted with bedside RN and RNCM. PT is not recommending follow up. Discharge plan is for pt to return home. CSW is signing off as no further social work needs identified. Please reconsult if a need arises prior to discharge.   Dede Query, MSW, Theresia Majors [phone]

## 2011-04-21 NOTE — Evaluation (Signed)
Occupational Therapy Evaluation Patient Details Name: Julia [name] MRN: [id number] DOB: June 21, 1965 Today's Date: 04/21/2011  Problem List: There is no problem list on file for this patient.   Past Medical History:  Past Medical History  Diagnosis Date  . Hypertension   . Diabetes mellitus   . COPD (chronic obstructive pulmonary disease)   . Asthma   . Shortness of breath   . Headache   . Arthritis   . Bipolar affective disorder   . Fibromyalgia   . Cirrhosis of liver   . Anxiety    Past Surgical History:  Past Surgical History  Procedure Date  . Neck surgery   . Cesarean section   . Tonsillectomy   . Back surgery     x2  . Knee arthroscopy     L    OT Assessment/Plan/Recommendation OT Assessment Clinical Impression Statement: 46 yo female s/p Right L4-5 diskectomy, decompression of the L4 and L5 nerve that could benefit from skilled OT acutely. Pt has 24/7 (A) from mother at d/c.  (see problem list below for details) OT Recommendation/Assessment: Patient will need skilled OT in the acute care venue OT Problem List: Decreased strength;Decreased activity tolerance;Impaired balance (sitting and/or standing);Decreased knowledge of precautions;Pain OT Therapy Diagnosis : Generalized weakness;Acute pain OT Plan OT Frequency: Min 2X/week OT Treatment/Interventions: Self-care/ADL training;DME and/or AE instruction;Therapeutic activities;Patient/family education;Balance training OT Recommendation Follow Up Recommendations: No OT follow up Equipment Recommended: None recommended by OT Individuals Consulted Consulted and Agree with Results and Recommendations: Family member/caregiver;Patient OT Goals Acute Rehab OT Goals OT Goal Formulation: With patient/family Time For Goal Achievement: 7 days ADL Goals Pt Will Perform Lower Body Bathing: with modified independence;Sit to stand from chair;Sit to stand from bed ADL Goal: Lower Body Bathing - Progress: Goal set today Pt  Will Perform Lower Body Dressing: with modified independence;Sit to stand from chair;Sit to stand from bed;with adaptive equipment ADL Goal: Lower Body Dressing - Progress: Goal set today Pt Will Transfer to Toilet: with modified independence;Regular height toilet ADL Goal: Toilet Transfer - Progress: Goal set today Miscellaneous OT Goals Miscellaneous OT Goal #1: Pt will verbalized 3 out 3 back precautions as precursor to ADLS OT Goal: Miscellaneous Goal #1 - Progress: Goal set today Miscellaneous OT Goal #2: PT will perform bed mobility with hob <20 moD I level as precursor to sink level ADLS OT Goal: Miscellaneous Goal #2 - Progress: Goal set today  OT Evaluation Precautions/Restrictions  Precautions Precautions: Back Precaution Booklet Issued: Yes (comment) Required Braces or Orthoses: No Restrictions Weight Bearing Restrictions: No Prior Functioning Home Living Lives With: Son;Other (Comment) (going home with mother)) Receives Help From: Family Type of Home: House Home Layout: One level Home Access: Stairs to enter Entrance Stairs-Rails: None Entrance Stairs-Number of Steps: 3 ((back door has rails and 5 steps)) Bathroom Shower/Tub: Forensic scientist: Standard Bathroom Accessibility: Yes How Accessible: Accessible via walker Home Adaptive Equipment: Straight cane Prior Function Level of Independence: Independent with basic ADLs;Independent with homemaking with ambulation;Independent with gait;Independent with transfers Able to Take Stairs?: Yes Driving: No Vocation: Unemployed ADL ADL Eating/Feeding: Performed;Set up Where Assessed - Eating/Feeding: Bed level;Chair Grooming: Performed;Teeth care;Supervision/safety Where Assessed - Grooming: Standing at sink Upper Body Bathing: Simulated;Chest;Right arm;Left arm;Abdomen;Set up Where Assessed - Upper Body Bathing: Sitting, bed;Unsupported Lower Body Bathing: Simulated;Moderate assistance Where  Assessed - Lower Body Bathing: Sitting, chair;Supported Location manager Dressing: Performed;Set up Where Assessed - Upper Body Dressing: Sitting, bed;Unsupported Toilet Transfer: Performed;Supervision/safety Toilet Transfer Method: Tourist information centre manager  Transfer Equipment: Regular height toilet Toileting - Clothing Manipulation: Performed;Supervision/safety Where Assessed - Toileting Clothing Manipulation: Sit to stand from 3-in-1 or toilet Toileting - Hygiene: Performed;Supervision/safety Where Assessed - Toileting Hygiene: Sit to stand from 3-in-1 or toilet Equipment Used: Other (comment) (none Rw left in room if pt wants to use to ambulate in hall) Ambulation Related to ADLs: Pt ambulating Min Guard (A). Pt reaching for hand held (A) initially and progressed to no (A) ADL Comments: Pt supine <>sit and ambulated to sink level for oral care. Pt then transfered to toilet transfer and then to sink for washing hands. Pt positioned in chair with back handout to review. Vision/Perception  Vision - History Baseline Vision: No visual deficits Patient Visual Report: No change from baseline Cognition Cognition Arousal/Alertness: Awake/alert Overall Cognitive Status: Appears within functional limits for tasks assessed Orientation Level: Oriented X4 Sensation/Coordination Sensation Light Touch: Appears Intact Coordination Gross Motor Movements are Fluid and Coordinated: Yes Fine Motor Movements are Fluid and Coordinated: Yes Extremity Assessment RUE Assessment RUE Assessment: Not tested (grossly) LUE Assessment LUE Assessment: Within Functional Limits (grossly ) Mobility  Bed Mobility Bed Mobility: Yes Rolling Left: 5: Supervision;With rail Rolling Left Details (indicate cue type and reason): pt with min v/c for technique to maintain back precautions and decrease pain Left Sidelying to Sit: 5: Supervision;With rails;HOB elevated (comment degrees) (HOB 20 degrees) Sitting - Scoot to Edge of Bed:  5: Supervision Sitting - Scoot to [place] of Bed Details (indicate cue type and reason): v/c for weight shifting and hand placement Transfers Transfers: Yes Sit to Stand: 4: Min assist;With upper extremity assist;From bed;From chair/3-in-1;From toilet Stand to Sit: 4: Min assist;With upper extremity assist;To chair/3-in-1 Stand to Sit Details: min v/c for hand placement Exercises   End of Session OT - End of Session Equipment Utilized During Treatment: Gait belt Activity Tolerance: Patient tolerated treatment well Patient left: in chair;with call bell in reach;with family/visitor present (mother present) Nurse Communication: Mobility status for transfers;Mobility status for ambulation General Behavior During Session: [facility] for tasks performed Cognition: [facility] for tasks performed   Lucile Shutters 04/21/2011, 12:11 PM  Pager: [phone]

## 2011-04-22 ENCOUNTER — Encounter (HOSPITAL_COMMUNITY): Payer: Self-pay | Admitting: Neurosurgery

## 2011-04-22 LAB — GLUCOSE, CAPILLARY
Glucose-Capillary: 41 mg/dL — CL (ref 70–99)
Glucose-Capillary: 45 mg/dL — ABNORMAL LOW (ref 70–99)

## 2011-04-22 NOTE — Progress Notes (Signed)
Utilization review completed. Dezaree Tracey, RN, BSN. 04/22/11  

## 2011-04-22 NOTE — Progress Notes (Signed)
OT Discharge Note  Patient is being discharged from OT services secondary to:    Goals met and no further therapy needs identified.  Please see latest Therapy Progress Note for current level of functioning and progress toward goals.  Progress and discharge plan and discussed with patient/caregiver and they    Agree    [name], Julia Greenspan Brynn   OTR/L Pager: [phone] Office: [phone] .     

## 2011-04-22 NOTE — Progress Notes (Signed)
Occupational Therapy Treatment Patient Details Name: Julia [name] MRN: [id number] DOB: May 22, 1965 Today's Date: 04/22/2011  OT Assessment/Plan OT Assessment/Plan Comments on Treatment Session: Pt at adequate level for d/c home without further OT needs. Pt demonstrates bed mobility at mod I level and no (A) from staff required.  OT Plan: Discharge plan remains appropriate OT Frequency: Min 2X/week Follow Up Recommendations: No OT follow up Equipment Recommended: None recommended by OT OT Goals Acute Rehab OT Goals OT Goal Formulation: With patient/family Time For Goal Achievement: 7 days ADL Goals Pt Will Perform Lower Body Bathing: with modified independence;Sit to stand from chair;Sit to stand from bed ADL Goal: Lower Body Bathing - Progress: Met Pt Will Perform Lower Body Dressing: with modified independence;Sit to stand from chair;Sit to stand from bed;with adaptive equipment ADL Goal: Lower Body Dressing - Progress: Met Pt Will Transfer to Toilet: with modified independence;Regular height toilet ADL Goal: Toilet Transfer - Progress: Met Miscellaneous OT Goals Miscellaneous OT Goal #1: Pt will verbalized 3 out 3 back precautions as precursor to ADLS OT Goal: Miscellaneous Goal #1 - Progress: Met Miscellaneous OT Goal #2: PT will perform bed mobility with hob <20 moD I level as precursor to sink level ADLS OT Goal: Miscellaneous Goal #2 - Progress: Met  OT Treatment Precautions/Restrictions  Precautions Precautions: Back Precaution Booklet Issued: Yes (comment) Required Braces or Orthoses: No Restrictions Weight Bearing Restrictions: No   ADL ADL Grooming:  (pt reports S level brushing teeth and mother in room agrees) Lower Body Dressing: Performed;Modified independent Where Assessed - Lower Body Dressing: Supine, head of bed up (HOB 20 degrees, crossing bil le) Equipment Used:  (no ae education needed) ADL Comments: Pt demonstrates ability to perform LB Adls without AE  needs. pt educated on shower instructions to be provided by RN at d/c home. Pt edcuated on maintaining medciation schedule at d/c and RN to provide handout with details. Pt provided demonstration on tub transfer and educated to use temped temperature water initially. Pt and mother without further questions Mobility  Bed Mobility Bed Mobility: Yes Rolling Left: 6: Modified independent (Device/Increase time) Left Sidelying to Sit: 6: Modified independent (Device/Increase time);HOB elevated (comment degrees) (20 degrees) Sitting - Scoot to Edge of Bed: 6: Modified independent (Device/Increase time) Transfers Transfers: Yes Sit to Stand: 6: Modified independent (Device/Increase time);With upper extremity assist;From bed Stand to Sit: 6: Modified independent (Device/Increase time);To bed;With upper extremity assist Exercises    End of Session OT - End of Session Activity Tolerance: Patient tolerated treatment well Patient left: in bed;with call bell in reach;with family/visitor present ( mother) General Behavior During Session: [facility] for tasks performed Cognition: [facility] for tasks performed (recalled all precautions without cues)  Lucile Shutters  04/22/2011, 1:17 PM Pager: [phone]

## 2011-04-22 NOTE — Discharge Summary (Signed)
  Admission and final dignosis l4 5 hnp. Diet rfegular. Medications,percocer,diazepam. Activity, no driving for 2 weeks. Condition at dc no pain .Marland Kitchen F/u 4 weeks

## 2011-04-22 NOTE — Progress Notes (Signed)
Physical Therapy Treatment Patient Details Name: Julia [name] MRN: [id number] DOB: 1965/06/21 Today's Date: 04/22/2011  PT Assessment/Plan  PT - Assessment/Plan Comments on Treatment Session: Pt progressing well. Pt was much more alert this session. Pt is safe to d/c home with mother at this time PT Plan: Discharge plan remains appropriate;Frequency remains appropriate PT Frequency: Min 5X/week Follow Up Recommendations: No PT follow up;Supervision - Intermittent Equipment Recommended: None recommended by PT;None recommended by OT PT Goals  Acute Rehab PT Goals PT Goal Formulation: With patient PT Goal: Supine/Side to Sit - Progress: Met PT Goal: Sit to Stand - Progress: Progressing toward goal PT Goal: Stand to Sit - Progress: Progressing toward goal PT Transfer Goal: Bed to Chair/Chair to Bed - Progress: Progressing toward goal PT Goal: Ambulate - Progress: Progressing toward goal PT Goal: Up/Down Stairs - Progress: Progressing toward goal  PT Treatment Precautions/Restrictions  Precautions Precautions: Back Precaution Booklet Issued: Yes (comment) Precaution Comments: Pt able to recall 2/3 precautions. VC for reminders of other precautions as well as throughout session functionally Required Braces or Orthoses: No Restrictions Weight Bearing Restrictions: No Mobility (including Balance) Bed Mobility Bed Mobility: Yes Rolling Left: 6: Modified independent (Device/Increase time) Left Sidelying to Sit: 6: Modified independent (Device/Increase time) Sitting - Scoot to Edge of Bed: 6: Modified independent (Device/Increase time) Transfers Transfers: Yes Sit to Stand: 5: Supervision Sit to Stand Details (indicate cue type and reason): VC for hand placement upon standing Stand to Sit: 5: Supervision Stand to Sit Details: VC for hand placement Ambulation/Gait Ambulation/Gait: Yes Ambulation/Gait Assistance: 5: Supervision Ambulation/Gait Assistance Details (indicate cue type and  reason): VC for proper sequencing and safety with distance of RW Ambulation Distance (Feet): 150 Feet Assistive device: Rolling walker Gait Pattern: Step-to pattern;Decreased stride length Gait velocity: Decreased gait speed Stairs: Yes Stairs Assistance: 5: Supervision Stairs Assistance Details (indicate cue type and reason): VC for proper stair sequencing Stair Management Technique: No rails;Forwards Number of Stairs: 2     Exercise    End of Session PT - End of Session Equipment Utilized During Treatment: Gait belt Activity Tolerance: Patient tolerated treatment well Patient left: in bed;with call bell in reach;with family/visitor present Nurse Communication: Mobility status for transfers;Mobility status for ambulation General Behavior During Session: [facility] for tasks performed Cognition: [facility] for tasks performed  Milana Kidney 04/22/2011, 1:44 PM  04/22/2011 Milana Kidney DPT PAGER: [phone] OFFICE: [phone]

## 2011-04-22 NOTE — Progress Notes (Signed)
Patient to f/u with her md for dm and bp

## 2011-05-08 ENCOUNTER — Emergency Department (HOSPITAL_COMMUNITY): Payer: Medicaid Other

## 2011-05-08 ENCOUNTER — Inpatient Hospital Stay (HOSPITAL_COMMUNITY)
Admission: EM | Admit: 2011-05-08 | Discharge: 2011-05-12 | DRG: 490 | Disposition: A | Discharge status: Home / Self Care | Payer: Medicaid Other | Attending: Neurosurgery | Admitting: Neurosurgery

## 2011-05-08 ENCOUNTER — Encounter (HOSPITAL_COMMUNITY): Payer: Self-pay | Admitting: *Deleted

## 2011-05-08 ENCOUNTER — Encounter (HOSPITAL_COMMUNITY)
Admission: EM | Disposition: A | Discharge status: Home / Self Care | Payer: Self-pay | Source: Home / Self Care | Attending: Neurosurgery

## 2011-05-08 ENCOUNTER — Encounter (HOSPITAL_COMMUNITY): Payer: Self-pay | Admitting: Anesthesiology

## 2011-05-08 ENCOUNTER — Emergency Department (HOSPITAL_COMMUNITY): Payer: Medicaid Other | Admitting: Anesthesiology

## 2011-05-08 DIAGNOSIS — G834 Cauda equina syndrome: Secondary | ICD-10-CM | POA: Diagnosis present

## 2011-05-08 DIAGNOSIS — J449 Chronic obstructive pulmonary disease, unspecified: Secondary | ICD-10-CM | POA: Diagnosis present

## 2011-05-08 DIAGNOSIS — J4489 Other specified chronic obstructive pulmonary disease: Secondary | ICD-10-CM | POA: Diagnosis present

## 2011-05-08 DIAGNOSIS — M5126 Other intervertebral disc displacement, lumbar region: Principal | ICD-10-CM | POA: Diagnosis present

## 2011-05-08 DIAGNOSIS — E119 Type 2 diabetes mellitus without complications: Secondary | ICD-10-CM | POA: Diagnosis present

## 2011-05-08 DIAGNOSIS — IMO0001 Reserved for inherently not codable concepts without codable children: Secondary | ICD-10-CM | POA: Diagnosis present

## 2011-05-08 HISTORY — PX: LUMBAR LAMINECTOMY/DECOMPRESSION MICRODISCECTOMY: SHX5026

## 2011-05-08 LAB — URINALYSIS, ROUTINE W REFLEX MICROSCOPIC
Glucose, UA: NEGATIVE mg/dL
Hgb urine dipstick: NEGATIVE
Leukocytes, UA: NEGATIVE
Specific Gravity, Urine: 1.013 (ref 1.005–1.030)
Urobilinogen, UA: 0.2 mg/dL (ref 0.0–1.0)

## 2011-05-08 LAB — GRAM STAIN: Gram Stain: NONE SEEN

## 2011-05-08 LAB — BASIC METABOLIC PANEL
BUN: 11 mg/dL (ref 6–23)
Chloride: 102 mEq/L (ref 96–112)
Glucose, Bld: 92 mg/dL (ref 70–99)
Potassium: 3.9 mEq/L (ref 3.5–5.1)

## 2011-05-08 LAB — CBC
HCT: 46.6 % — ABNORMAL HIGH (ref 36.0–46.0)
Hemoglobin: 16.3 g/dL — ABNORMAL HIGH (ref 12.0–15.0)
RBC: 5.01 MIL/uL (ref 3.87–5.11)
WBC: 11.1 10*3/uL — ABNORMAL HIGH (ref 4.0–10.5)

## 2011-05-08 SURGERY — LUMBAR LAMINECTOMY/DECOMPRESSION MICRODISCECTOMY 1 LEVEL
Anesthesia: General | Site: Back | Wound class: Clean

## 2011-05-08 MED ORDER — ONDANSETRON HCL 4 MG/2ML IJ SOLN
INTRAMUSCULAR | Status: DC | PRN
  Administered 2011-05-08: 4 mg via INTRAVENOUS

## 2011-05-08 MED ORDER — DROPERIDOL 2.5 MG/ML IJ SOLN
INTRAMUSCULAR | Status: DC | PRN
  Administered 2011-05-08: 0.625 mg via INTRAVENOUS

## 2011-05-08 MED ORDER — HYDROMORPHONE HCL PF 2 MG/ML IJ SOLN
2.0000 mg | Freq: Once | INTRAMUSCULAR | Status: AC
  Administered 2011-05-08: 2 mg via INTRAMUSCULAR
  Filled 2011-05-08: qty 1

## 2011-05-08 MED ORDER — METOCLOPRAMIDE HCL 5 MG/ML IJ SOLN
INTRAMUSCULAR | Status: DC | PRN
  Administered 2011-05-08: 10 mg via INTRAVENOUS

## 2011-05-08 MED ORDER — DEXAMETHASONE SODIUM PHOSPHATE 4 MG/ML IJ SOLN
INTRAMUSCULAR | Status: DC | PRN
  Administered 2011-05-08: 8 mg via INTRAVENOUS

## 2011-05-08 MED ORDER — LIDOCAINE HCL (CARDIAC) 20 MG/ML IV SOLN
INTRAVENOUS | Status: DC | PRN
  Administered 2011-05-08: 80 mg via INTRAVENOUS

## 2011-05-08 MED ORDER — DEXTROSE 5 % IV SOLN
INTRAVENOUS | Status: DC | PRN
  Administered 2011-05-08: 21:00:00 via INTRAVENOUS

## 2011-05-08 MED ORDER — LORAZEPAM 2 MG/ML IJ SOLN: 1.0000 mg | Freq: Once | INTRAMUSCULAR | Status: AC | PRN

## 2011-05-08 MED ORDER — FENTANYL CITRATE 0.05 MG/ML IJ SOLN: 50.0000 ug | INTRAMUSCULAR | Status: DC | PRN

## 2011-05-08 MED ORDER — PROPOFOL 10 MG/ML IV EMUL
INTRAVENOUS | Status: DC | PRN
  Administered 2011-05-08: 180 mg via INTRAVENOUS

## 2011-05-08 MED ORDER — VECURONIUM BROMIDE 10 MG IV SOLR
INTRAVENOUS | Status: DC | PRN
  Administered 2011-05-08: 2 mg via INTRAVENOUS

## 2011-05-08 MED ORDER — SODIUM CHLORIDE 0.9 % IR SOLN
Status: DC | PRN
  Administered 2011-05-08: 21:00:00

## 2011-05-08 MED ORDER — LACTATED RINGERS IV SOLN
INTRAVENOUS | Status: DC | PRN
  Administered 2011-05-08 (×2): via INTRAVENOUS

## 2011-05-08 MED ORDER — GLYCOPYRROLATE 0.2 MG/ML IJ SOLN
INTRAMUSCULAR | Status: DC | PRN
  Administered 2011-05-08: .6 mg via INTRAVENOUS

## 2011-05-08 MED ORDER — MORPHINE SULFATE 4 MG/ML IJ SOLN
6.0000 mg | Freq: Once | INTRAMUSCULAR | Status: AC
  Administered 2011-05-08: 6 mg via INTRAVENOUS
  Filled 2011-05-08: qty 1

## 2011-05-08 MED ORDER — HYDROMORPHONE HCL PF 1 MG/ML IJ SOLN
0.2500 mg | INTRAMUSCULAR | Status: DC | PRN
  Administered 2011-05-08 – 2011-05-09 (×4): 0.5 mg via INTRAVENOUS

## 2011-05-08 MED ORDER — 0.9 % SODIUM CHLORIDE (POUR BTL) OPTIME
TOPICAL | Status: DC | PRN
  Administered 2011-05-08: 1000 mL

## 2011-05-08 MED ORDER — CEFAZOLIN SODIUM 1-5 GM-% IV SOLN
INTRAVENOUS | Status: DC | PRN
  Administered 2011-05-08: 2 g via INTRAVENOUS

## 2011-05-08 MED ORDER — FENTANYL CITRATE 0.05 MG/ML IJ SOLN
INTRAMUSCULAR | Status: DC | PRN
  Administered 2011-05-08 (×4): 50 ug via INTRAVENOUS
  Administered 2011-05-08: 100 ug via INTRAVENOUS

## 2011-05-08 MED ORDER — MIDAZOLAM HCL 2 MG/2ML IJ SOLN: 1.0000 mg | INTRAMUSCULAR | Status: DC | PRN

## 2011-05-08 MED ORDER — HYDROMORPHONE HCL PF 1 MG/ML IJ SOLN
INTRAMUSCULAR | Status: AC
  Filled 2011-05-08: qty 1

## 2011-05-08 MED ORDER — INSULIN ASPART 100 UNIT/ML ~~LOC~~ SOLN
0.0000 [IU] | Freq: Three times a day (TID) | SUBCUTANEOUS | Status: DC
  Administered 2011-05-09: 3 [IU] via SUBCUTANEOUS
  Administered 2011-05-09: 5 [IU] via SUBCUTANEOUS
  Administered 2011-05-09: 11 [IU] via SUBCUTANEOUS
  Administered 2011-05-10: 3 [IU] via SUBCUTANEOUS
  Administered 2011-05-10 – 2011-05-12 (×3): 2 [IU] via SUBCUTANEOUS
  Filled 2011-05-08 (×2): qty 3

## 2011-05-08 MED ORDER — THROMBIN 20000 UNITS EX KIT
PACK | CUTANEOUS | Status: DC | PRN
  Administered 2011-05-08: 21:00:00 via TOPICAL

## 2011-05-08 MED ORDER — NEOSTIGMINE METHYLSULFATE 1 MG/ML IJ SOLN
INTRAMUSCULAR | Status: DC | PRN
  Administered 2011-05-08: 5 mg via INTRAVENOUS

## 2011-05-08 MED ORDER — PROMETHAZINE HCL 25 MG/ML IJ SOLN: 6.2500 mg | INTRAMUSCULAR | Status: DC | PRN

## 2011-05-08 MED ORDER — ROCURONIUM BROMIDE 100 MG/10ML IV SOLN
INTRAVENOUS | Status: DC | PRN
  Administered 2011-05-08: 50 mg via INTRAVENOUS

## 2011-05-08 MED ORDER — DEXTROSE-NACL 5-0.9 % IV SOLN
INTRAVENOUS | Status: DC
  Administered 2011-05-08: 20:00:00 via INTRAVENOUS

## 2011-05-08 MED ORDER — MORPHINE SULFATE 4 MG/ML IJ SOLN
4.0000 mg | Freq: Once | INTRAMUSCULAR | Status: AC
  Administered 2011-05-08: 4 mg via INTRAVENOUS
  Filled 2011-05-08: qty 1

## 2011-05-08 SURGICAL SUPPLY — 51 items
ADH SKN CLS APL DERMABOND .7 (GAUZE/BANDAGES/DRESSINGS)
APL SKNCLS STERI-STRIP NONHPOA (GAUZE/BANDAGES/DRESSINGS)
BAG DECANTER FOR FLEXI CONT (MISCELLANEOUS) ×3 IMPLANT
BENZOIN TINCTURE PRP APPL 2/3 (GAUZE/BANDAGES/DRESSINGS) IMPLANT
BLADE SURG ROTATE 9660 (MISCELLANEOUS) IMPLANT
CANISTER SUCTION 2500CC (MISCELLANEOUS) ×3 IMPLANT
CLOTH BEACON ORANGE TIMEOUT ST (SAFETY) ×3 IMPLANT
CONT SPEC 4OZ CLIKSEAL STRL BL (MISCELLANEOUS) ×3 IMPLANT
DERMABOND ADVANCED (GAUZE/BANDAGES/DRESSINGS)
DERMABOND ADVANCED .7 DNX12 (GAUZE/BANDAGES/DRESSINGS) IMPLANT
DRAPE LAPAROTOMY 100X72X124 (DRAPES) ×3 IMPLANT
DRAPE POUCH INSTRU U-SHP 10X18 (DRAPES) ×3 IMPLANT
DRAPE SURG 17X23 STRL (DRAPES) ×1 IMPLANT
DURAPREP 26ML APPLICATOR (WOUND CARE) ×3 IMPLANT
ELECT CAUTERY BLADE 6.4 (BLADE) ×3 IMPLANT
ELECT REM PT RETURN 9FT ADLT (ELECTROSURGICAL) ×3
ELECTRODE REM PT RTRN 9FT ADLT (ELECTROSURGICAL) ×2 IMPLANT
GAUZE SPONGE 4X4 12PLY STRL LF (GAUZE/BANDAGES/DRESSINGS) ×2 IMPLANT
GAUZE SPONGE 4X4 16PLY XRAY LF (GAUZE/BANDAGES/DRESSINGS) IMPLANT
GLOVE BIO SURGEON STRL SZ 6.5 (GLOVE) ×2 IMPLANT
GLOVE BIO SURGEON STRL SZ7 (GLOVE) ×2 IMPLANT
GLOVE ECLIPSE 7.5 STRL STRAW (GLOVE) ×3 IMPLANT
GLOVE EXAM NITRILE LRG STRL (GLOVE) IMPLANT
GLOVE EXAM NITRILE MD LF STRL (GLOVE) IMPLANT
GLOVE EXAM NITRILE XL STR (GLOVE) IMPLANT
GLOVE EXAM NITRILE XS STR PU (GLOVE) IMPLANT
GOWN BRE IMP SLV AUR LG STRL (GOWN DISPOSABLE) ×4 IMPLANT
GOWN BRE IMP SLV AUR XL STRL (GOWN DISPOSABLE) IMPLANT
GOWN STRL REIN 2XL LVL4 (GOWN DISPOSABLE) IMPLANT
KIT BASIN OR (CUSTOM PROCEDURE TRAY) ×3 IMPLANT
KIT ROOM TURNOVER OR (KITS) ×3 IMPLANT
NDL HYPO 25X1 1.5 SAFETY (NEEDLE) IMPLANT
NEEDLE HYPO 25X1 1.5 SAFETY (NEEDLE) IMPLANT
NS IRRIG 1000ML POUR BTL (IV SOLUTION) ×3 IMPLANT
PACK LAMINECTOMY NEURO (CUSTOM PROCEDURE TRAY) ×3 IMPLANT
PAD ARMBOARD 7.5X6 YLW CONV (MISCELLANEOUS) ×9 IMPLANT
SEALANT DURASEAL SPINE 3ML (Neuro Prosthesis/Implant) ×2 IMPLANT
SPONGE GAUZE 4X4 12PLY (GAUZE/BANDAGES/DRESSINGS) ×2 IMPLANT
SPONGE LAP 4X18 X RAY DECT (DISPOSABLE) IMPLANT
STRIP CLOSURE SKIN 1/2X4 (GAUZE/BANDAGES/DRESSINGS) IMPLANT
SUT VIC AB 0 CT1 18XCR BRD8 (SUTURE) ×3 IMPLANT
SUT VIC AB 0 CT1 8-18 (SUTURE) ×6
SUT VIC AB 2-0 CP2 18 (SUTURE) ×3 IMPLANT
SUT VIC AB 3-0 SH 8-18 (SUTURE) ×3 IMPLANT
SWAB CULTURE LIQ STUART DBL (MISCELLANEOUS) ×2 IMPLANT
SYR 20ML ECCENTRIC (SYRINGE) ×3 IMPLANT
TAPE CLOTH SURG 4X10 WHT LF (GAUZE/BANDAGES/DRESSINGS) ×2 IMPLANT
TOWEL OR 17X24 6PK STRL BLUE (TOWEL DISPOSABLE) ×3 IMPLANT
TOWEL OR 17X26 10 PK STRL BLUE (TOWEL DISPOSABLE) ×3 IMPLANT
TUBE ANAEROBIC SPECIMEN COL (MISCELLANEOUS) ×2 IMPLANT
WATER STERILE IRR 1000ML POUR (IV SOLUTION) ×3 IMPLANT

## 2011-05-08 NOTE — Op Note (Signed)
05/08/2011  11:09 PM  PATIENT:  Julia [name]  46 y.o. 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 He bent over last night and had severe back and leg pain worse to the left sides with bowel and bladder incontinence present to the emergency room in today an MRI was done showing fluid collection at surgical site and recurrent disc herniation causing severe stenosis patient with some weakness and left leg and some numbness in patient brought in for explore per the wound are redo lumbar lamina and the discectomy.  Patient from the operating room general anesthesia was induced and patient was placed in a prone position low pressure points padded in a Wilson frame. Patient prepped draped sterile fashion incision was made through previous scar. Immediately clear fluid was found cultures were sense. Starting retractor was placed with C.) to the laminar defects and retractors placed in we then the marker and the interspace and x-ray show we are the fourth webspace microscope was brought in for microdissection. We proceeded dura and she underwent this was repaired with Prolene 6-0 Prolene suture. No further CSF was seen also was negative for any further CSF leakage. We then explored the epidural space and found a large fragment of disc up under the dura and pieces of disc going caudally and a cross the disc space and across midline using a Veress hooks and  curettes pituitary rongeurs we removed the disc decompressing the central sac the 4 and 5 roots. Original report cardiac side with removal of fragments of disc where we had very good decompression the central canal. Hemostasis was confirmed trauma in this area not. Hemostasis the dura was then sealed with the DuraSeal retractors removed fascia closed with 0 Vicryl interrupted sutures subcutaneous tissue closed with cervical interrupted sutures skin closed staples dressing placed patient was awoken from anesthesia and  transferred recovery * PLAN OF CARE: Admit to inpatient   PATIENT DISPOSITION:  PACU - hemodynamically stable.

## 2011-05-08 NOTE — ED Notes (Signed)
14 french foley cath inserted

## 2011-05-08 NOTE — Anesthesia Preprocedure Evaluation (Signed)
Anesthesia Evaluation  Patient identified by MRN, date of birth, ID band Patient awake    Reviewed: Allergy & Precautions, H&P , NPO status , Patient's Chart, lab work & pertinent test results  Airway Mallampati: II TM Distance: >3 FB Neck ROM: Full    Dental   Pulmonary asthma , COPD + rhonchi        Cardiovascular hypertension,     Neuro/Psych  Headaches, PSYCHIATRIC DISORDERS  Neuromuscular disease    GI/Hepatic   Endo/Other  Diabetes mellitus-  Renal/GU      Musculoskeletal  (+) Fibromyalgia -  Abdominal (+) obese,   Peds  Hematology   Anesthesia Other Findings   Reproductive/Obstetrics                           Anesthesia Physical Anesthesia Plan  ASA: III and Emergent  Anesthesia Plan: General   Post-op Pain Management:    Induction: Intravenous  Airway Management Planned: Oral ETT  Additional Equipment:   Intra-op Plan:   Post-operative Plan: Extubation in OR  Informed Consent: I have reviewed the patients History and Physical, chart, labs and discussed the procedure including the risks, benefits and alternatives for the proposed anesthesia with the patient or authorized representative who has indicated his/her understanding and acceptance.     Plan Discussed with: CRNA and Surgeon  Anesthesia Plan Comments:         Anesthesia Quick Evaluation

## 2011-05-08 NOTE — Transfer of Care (Signed)
Immediate Anesthesia Transfer of Care Note  Patient: Julia [name]  Procedure(s) Performed: Procedure(s) (LRB): LUMBAR LAMINECTOMY/DECOMPRESSION MICRODISCECTOMY 1 LEVEL (N/A)  Patient Location: PACU  Anesthesia Type: General  Level of Consciousness: awake, oriented, sedated, patient cooperative and responds to stimulation  Airway & Oxygen Therapy: Patient Spontanous Breathing and Patient connected to nasal cannula oxygen  Post-op Assessment: Report given to PACU RN, Post -op Vital signs reviewed and stable, Patient moving all extremities and Patient moving all extremities X 4  Post vital signs: Reviewed and stable  Complications: No apparent anesthesia complications

## 2011-05-08 NOTE — ED Notes (Signed)
Report given to [facility], CRNA.

## 2011-05-08 NOTE — ED Notes (Addendum)
Pt reports having back surgery 3 weeks ago, having severe lower back pain, radiates down both legs, more severe to left leg, reports being incontinent of bowel and urine last night and difficulty bearing weight.

## 2011-05-08 NOTE — ED Provider Notes (Signed)
Medical screening examination/treatment/procedure(s) were conducted as a shared visit with non-physician practitioner(s) and myself.  I personally evaluated the patient during the encounter  Please see my other notes for more details  Lyanne Co, MD 05/08/11 2021

## 2011-05-08 NOTE — ED Provider Notes (Signed)
History     CSN: [id number]  Arrival date & time 05/08/11  1431   First MD Initiated Contact with Patient 05/08/11 1525      Chief Complaint  Patient presents with  . Back Pain    (Consider location/radiation/quality/duration/timing/severity/associated sxs/prior treatment) Patient is a 46 y.o. female presenting with back pain. The history is provided by the patient. No language interpreter was used.  Back Pain  This is a recurrent problem. The current episode started yesterday. The problem has not changed since onset.Associated with: bending over. The quality of the pain is described as shooting and burning. The pain radiates to the left thigh. The pain is at a severity of 10/10. The pain is severe. The symptoms are aggravated by bending and certain positions (walking). The pain is the same all the time. Associated symptoms include bowel incontinence, bladder incontinence and leg pain. Pertinent negatives include no fever, no perianal numbness, no dysuria, no pelvic pain, no paresthesias, no paresis and no tingling. Treatments tried: narcotic pain meds.   46 year old female coming in today with lower back pain with radiation into the left thigh since last night when she bent over. Patient also had bowel and bladder incontinence last night. This has resolved today. Patient had L 4-5 disectomy  surgery 3 weeks ago per Dr. Jeral Fruit.  Patient has good sensation to the left lower extremity. LLE is somewhat weaker than the RLE. Denies fever.  NO DTR on L.  Past Medical History  Diagnosis Date  . Hypertension   . Diabetes mellitus   . COPD (chronic obstructive pulmonary disease)   . Asthma   . Shortness of breath   . Headache   . Arthritis   . Bipolar affective disorder   . Fibromyalgia   . Cirrhosis of liver   . Anxiety     Past Surgical History  Procedure Date  . Neck surgery   . Cesarean section   . Tonsillectomy   . Back surgery     x2  . Knee arthroscopy     L  . Lumbar  laminectomy/decompression microdiscectomy 04/20/2011    Procedure: LUMBAR LAMINECTOMY/DECOMPRESSION MICRODISCECTOMY;  Surgeon: Karn Cassis, MD;  Location: [facility];  Service: Neurosurgery;  Laterality: Right;  Right Lumbar four- five Diskectomy    History reviewed. No pertinent family history.  History  Substance Use Topics  . Smoking status: Current Everyday Smoker -- 0.5 packs/day  . Smokeless tobacco: Not on file  . Alcohol Use: No    OB History    Grav Para Term Preterm Abortions TAB SAB Ect Mult Living                  Review of Systems  Constitutional: Negative for fever.  Gastrointestinal: Positive for bowel incontinence.  Genitourinary: Positive for bladder incontinence. Negative for dysuria and pelvic pain.  Musculoskeletal: Positive for back pain.  Neurological: Negative for tingling and paresthesias.    Allergies  Codeine and Gabapentin  Home Medications   Current Outpatient Rx  Name Route Sig Dispense Refill  . AMLODIPINE BESYLATE 10 MG PO TABS Oral Take 10 mg by mouth daily.    . INSULIN ISOPHANE & REGULAR (70-30) 100 UNIT/ML [place] SUSP Subcutaneous Inject 30-50 Units into the skin 2 (two) times daily with a meal. 50 units in the morning; 30 units in the evening    . LISINOPRIL-HYDROCHLOROTHIAZIDE 10-12.5 MG PO TABS Oral Take 1 tablet by mouth daily.    . OXYCODONE-ACETAMINOPHEN 10-325 MG PO TABS  Oral Take 1 tablet by mouth every 4 (four) hours as needed.    Marland Kitchen RISPERIDONE 1 MG PO TABS Oral Take 1-2 mg by mouth 2 (two) times daily. 1mg  in the morning; 2mg  in the evening    . TIOTROPIUM BROMIDE MONOHYDRATE 18 MCG IN CAPS Inhalation Place 18 mcg into inhaler and inhale daily.      BP 124/87  Pulse 110  Temp(Src) 98.5 F (36.9 C) (Oral)  Resp 20  SpO2 95%  LMP 04/18/2011  Physical Exam  Nursing note and vitals reviewed. Constitutional: She is oriented to person, place, and time. She appears well-developed and well-nourished.       obese  HENT:    Head: Normocephalic and atraumatic.  Eyes: Conjunctivae and EOM are normal. Pupils are equal, round, and reactive to light.  Neck: Normal range of motion. Neck supple.  Cardiovascular: Normal rate, regular rhythm, normal heart sounds and intact distal pulses.  Exam reveals no gallop and no friction rub.   No murmur heard. Pulmonary/Chest: Effort normal and breath sounds normal.  Abdominal: Soft. Bowel sounds are normal.  Musculoskeletal: Normal range of motion. She exhibits no edema and no tenderness.  Neurological: She is alert and oriented to person, place, and time.       LLE weakness with no DTR  Skin: Skin is warm and dry.       Lumbar incision unremarkable with no drainage, reddness temp normal.  Psychiatric: She has a normal mood and affect.    ED Course  Procedures (including critical care time)  Labs Reviewed - No data to display No results found.   No diagnosis found.    MDM   74:61 46 year old patient with lower back pain radiating into the left thigh after L4-5 and disectomy 3 weeks ago per Dr. Jeral Fruit.  Incontinent of  bowel and bladder last p.m. has resolved presently. She does have weakness in the left lower extremity with no DTRs. MRI shows cauda equina  Compression. Dr. Patria Mane will consult neurosurgery.  2000 Dr. Phoebe Perch in to see patient. Patient will be going to the OR for lumbar laminectomy.    Jethro Bastos, NP 05/08/11 [phone]

## 2011-05-08 NOTE — ED Notes (Signed)
Pt st's she had lumbar surg. 2 1/2 weeks ago.  St's she bent over last pm to pick something up and back locked up causing severe pain.  Pt st's pain radiates down both legs  Also c/o pain with ambulation. Pt took her Oxycodone 10 this am without relief.

## 2011-05-08 NOTE — ED Notes (Signed)
Pt st's slight relief with pain med.

## 2011-05-08 NOTE — ED Notes (Signed)
Pain med given, consent signed, IVF infusing/patent, site U, dentures removed (given to mother at [facility], mother has all belongings), waiting on OR, last ate Saturday, last PO fluid intake water at 1100.

## 2011-05-08 NOTE — H&P (Signed)
Subjective: Pt had R Lum Lam and discectomy 3 weeks ago by Dr Jeral Fruit. Did well until yesterday when she bent over and had extreme pain back and legs worsre to left.  The patient presents with worsening pain as well as 2 episodes of urinary incontinence last night and bowl incontinence. She's had no fevers or chills.  MRI was obtained to evaluate but appears to demonstrate evidence of thecal sac compression and concern for clinical cauda equina syndrome. Patient had a Foley catheter placed.    There are no active problems to display for this patient.  Past Medical History  Diagnosis Date  . Hypertension   . Diabetes mellitus   . COPD (chronic obstructive pulmonary disease)   . Asthma   . Shortness of breath   . Headache   . Arthritis   . Bipolar affective disorder   . Fibromyalgia   . Cirrhosis of liver   . Anxiety     Past Surgical History  Procedure Date  . Neck surgery   . Cesarean section   . Tonsillectomy   . Back surgery     x2  . Knee arthroscopy     L  . Lumbar laminectomy/decompression microdiscectomy 04/20/2011    Procedure: LUMBAR LAMINECTOMY/DECOMPRESSION MICRODISCECTOMY;  Surgeon: Karn Cassis, MD;  Location: [facility];  Service: Neurosurgery;  Laterality: Right;  Right Lumbar four- five Diskectomy     (Not in a hospital admission) Allergies  Allergen Reactions  . Codeine Hives  . Gabapentin Rash    History  Substance Use Topics  . Smoking status: Current Everyday Smoker -- 0.5 packs/day  . Smokeless tobacco: Not on file  . Alcohol Use: No    History reviewed. No pertinent family history.  Review of Systems A comprehensive review of systems was negative.  Objective: Vital signs in last 24 hours: Temp:  [98.5 F (36.9 C)] 98.5 F (36.9 C) (02/17 1436) Pulse Rate:  [97-110] 97  (02/17 1751) Resp:  [20-22] 22  (02/17 1751) BP: (107-124)/(75-87) 107/75 mmHg (02/17 1751) SpO2:  [94 %-95 %] 94 % (02/17 1751)  incision - well healed, sl induration  , no erythema Neuro - motor - 5/5 all except 4/5 Left DF/PF , sensation sl decreased Left L5 , DTR , diminished, but symetric , no clonus ,  Per ER MD - good rectal tone  Data Review CBC:  Lab Results  Component Value Date   WBC 11.1* 05/08/2011   RBC 5.01 05/08/2011     02/17 1833       WBC  11.1     RBC  5.01     HGB  16.3     HCT  46.6     Platelets  150     Sodium  137     Potassium  3.9     Chloride  102     CO2  24     BUN  11     Creat  0.53     Calcium  10.4         Mr Lumbar Spine Wo Contrast  05/08/2011 *RADIOLOGY REPORT* Clinical Data: L4-L5 discectomy. Severe low back pain with urinary incontinence. MRI LUMBAR SPINE WITHOUT CONTRAST Technique: Multiplanar and multiecho pulse sequences of the lumbar spine were obtained without intravenous contrast. Comparison: 04/18/2011 radiographs. 04/20/2011 intraoperative localization. 12/16/2010 lumbar spine preoperative MRI. Findings: Postoperative changes are present at L4-L5. Incidental imaging the paraspinal soft tissues demonstrate a fluid density structure in the right upper quadrant which  probably represents either distended gallbladder or partial visualization of the gastric antrum. Vertebral body height is preserved. The alignment is unchanged. Levels from T11-T12 through L2-L3 are unchanged. L3-L4: Disc desiccation with shallow broad-based posterior disc bulging. The right foraminal stenosis is present which is multifactorial. There is right facet hypertrophy, right facet effusion and right foraminal protrusion producing moderate to severe right foraminal stenosis with impingement on the exiting right L3 nerve. Lateral recesses appear patent. Central canal is patent. L4-L5: There is a large superficial posterior fluid collection with susceptibility artifact around the periphery, likely representing metallic debris from recent operation or hemosiderin. Largest axial measurement is 63 mm AP by 40 mm transverse. Cranial- caudal  extent of this superficial fluid collection is 74 mm. This extends to the L4-L5 right laminotomy bed. There is deep extension of this fluid collection which also appears to communicate with the right facet joint. The deep component on axial imaging measures 12 mm x 11 mm with debris. There is compression of the thecal sac which is multifactorial. Severe central stenosis is present. Fluid signal is present within the disc which may be postoperative however early postoperative infection cannot be excluded. Similar differential considerations apply to the right facet joint. There is a central to right paracentral disc extrusion with caudal extension of disc material. Sequestered fragment is present in the right lateral recess compressing the descending right L5 nerve. Both lateral recesses show compression of the descending nerve associated with severe central stenosis at L4-L5. Both neural foramina appear patent. L5-S1: Degenerative endplate changes with broad-based posterior disc bulge and endplate osteophytes. Minimal symmetric bilateral foraminal stenosis is unchanged. Bilateral facet hypertrophy. Central canal patent. Mild encroachment of both lateral recesses associated with broad-based disc bulge, osteophytes and facet hypertrophy, greater on the right than left. IMPRESSION: 1. Interval postoperative changes at L4-L5. New fluid within the disc space could potentially be postoperative. Early infection is not excluded. Clinical correlation is recommended. 2. Persistent or recurrent disc extrusion at L4-L5 extending inferiorly into the right lateral recess with sequestered fragment inferior to the disc space. Bilateral lateral recess stenosis and compression of the descending L5 nerve roots. Severe L4-L5 central stenosis is multifactorial, associated with disc extrusion, right posterior epidural fluid collection with mass effect on the thecal sac and facet hypertrophy. Septic right facet arthritis is not excluded.  There is compression of the nerve roots of the cauda equina. Differential considerations for fluid collections include postoperative seroma, hematoma and dural leak. 3. Unchanged appearance of L3-L4 and L5-S1 with notable right foraminal stenosis at L3-L4 potentially affecting the right L3 nerve.  Assessment/Plan: Pt with recurrent HNP, stenosis, ?fluid collection all causing near cauda equina lik symptoms -   Admit to OR for redo Eugenie Birks, MD 05/08/2011 7:54 PM

## 2011-05-08 NOTE — ED Notes (Signed)
CBG 100 : Rn notified 

## 2011-05-08 NOTE — ED Notes (Signed)
OR ready, to OR with RN and pt's mother.

## 2011-05-08 NOTE — ED Provider Notes (Signed)
6:29 PM Spoke with Dr Phoebe Perch who will evaluate the patient in the ER  The patient presents with worsening pain as well as 2 episodes of urinary incontinence last night.  She has 4+ out of 5 strength in her bilateral lower extremities.  Secondary to her pain I was unable to obtain a good reflex exam.  She does have anal tone.  She's had no fevers or chills.  Her incision looks good and MRI was obtained to evaluate but appears to demonstrate evidence of thecal sac compression and concern for clinical cauda equina syndrome.  Patient while a Foley catheter placed.  There is a delay in obtaining laboratory data on her secondary to IV access issues.  She now has a CBC BMP and sedimentation rate pending at this time.  Obtain blood cultures as well.  Dg Chest 2 View  04/20/2011  *RADIOLOGY REPORT*  Clinical Data: Preoperative evaluation for lumbar disc surgery, hypertension, diabetes  CHEST - 2 VIEW  Comparison:  02/05/2010  Findings:  The heart size and mediastinal contours are within normal limits.  Both lungs are clear.  The visualized skeletal structures are unremarkable.  IMPRESSION: No active cardiopulmonary disease.  Original Report Authenticated By: Judie Petit. Ruel Favors, M.D.   Mr Lumbar Spine Wo Contrast  05/08/2011  *RADIOLOGY REPORT*  Clinical Data: L4-L5 discectomy.  Severe low back pain with urinary incontinence.  MRI LUMBAR SPINE WITHOUT CONTRAST  Technique:  Multiplanar and multiecho pulse sequences of the lumbar spine were obtained without intravenous contrast.  Comparison: 04/18/2011 radiographs.  04/20/2011 intraoperative localization.  12/16/2010 lumbar spine preoperative MRI.  Findings: Postoperative changes are present at L4-L5.  Incidental imaging the paraspinal soft tissues demonstrate a fluid density structure in the right upper quadrant which probably represents either distended gallbladder or partial visualization of the gastric antrum.  Vertebral body height is preserved. The alignment is  unchanged.  Levels from T11-T12 through L2-L3 are unchanged.  L3-L4:  Disc desiccation with shallow broad-based posterior disc bulging.  The right foraminal stenosis is present which is multifactorial.  There is right facet hypertrophy, right facet effusion and right foraminal protrusion producing moderate to severe right foraminal stenosis with impingement on the exiting right L3 nerve.  Lateral recesses appear patent.  Central canal is patent.  L4-L5:  There is a large superficial posterior fluid collection with susceptibility artifact around the periphery, likely representing metallic debris from recent operation or hemosiderin. Largest axial measurement is 63 mm AP by 40 mm transverse.  Cranial- caudal extent of this superficial fluid collection is 74 mm.  This extends to the L4-L5 right laminotomy bed.  There is deep extension of this fluid collection which also appears to communicate with the right facet joint.  The deep component on axial imaging measures 12 mm x 11 mm with debris.  There is compression of the thecal sac which is multifactorial.  Severe central stenosis is present.  Fluid signal is present within the disc which may be postoperative however early postoperative infection cannot be excluded.  Similar differential considerations apply to the right facet joint.  There is a central to right paracentral disc extrusion with caudal extension of disc material. Sequestered fragment is present in the right lateral recess compressing the descending right L5 nerve.  Both lateral recesses show compression of the descending nerve associated with severe central stenosis at L4-L5.  Both neural foramina appear patent.  L5-S1:  Degenerative endplate changes with broad-based posterior disc bulge and endplate osteophytes.  Minimal symmetric bilateral foraminal  stenosis is unchanged.  Bilateral facet hypertrophy. Central canal patent.  Mild encroachment of both lateral recesses associated with broad-based disc  bulge, osteophytes and facet hypertrophy, greater on the right than left.  IMPRESSION: 1.  Interval postoperative changes at L4-L5.  New fluid within the disc space could potentially be postoperative.  Early infection is not excluded.  Clinical correlation is recommended. 2.  Persistent or recurrent disc extrusion at L4-L5 extending inferiorly into the right lateral recess with sequestered fragment inferior to the disc space.  Bilateral lateral recess stenosis and compression of the descending L5 nerve roots.  Severe L4-L5 central stenosis is multifactorial, associated with disc extrusion, right posterior epidural fluid collection with mass effect on the thecal sac and facet hypertrophy.  Septic right facet arthritis is not excluded.  There is compression of the nerve roots of the cauda equina.  Differential considerations for fluid collections include postoperative seroma, hematoma and dural leak. 3.  Unchanged appearance of L3-L4 and L5-S1 with notable right foraminal stenosis at L3-L4 potentially affecting the right L3 nerve.  Critical (Cauda Equina compression) results were called by telephone on 05/08/2011  at  1758 hours to  Dr. Patria Mane, who verbally acknowledged these results.  Original Report Authenticated By: Andreas Newport, M.D.   Dg Lumbar Spine 1 View  04/20/2011  *RADIOLOGY REPORT*  Clinical Data: Discectomy.  LUMBAR SPINE - 1 VIEW  Comparison: 04/18/2011.  Findings: Single lateral intraoperative radiograph for localization demonstrates a probe over what appears to be L4-L5.  Soft tissue retractors are present dorsal to L5.  IMPRESSION: Intraoperative localization at L4-L5.  Original Report Authenticated By: Andreas Newport, M.D.   I discussed the findings with the radiologist and personally reviewed the MRI  Lyanne Co, MD 05/08/11 [phone]

## 2011-05-08 NOTE — Preoperative (Signed)
Beta Blockers   Reason not to administer Beta Blockers:Not Applicable 

## 2011-05-08 NOTE — Anesthesia Procedure Notes (Signed)
Procedure Name: Intubation Date/Time: 05/08/2011 9:07 PM Performed by: Wray Kearns A Pre-anesthesia Checklist: Patient identified, Timeout performed, Emergency Drugs available, Suction available and Patient being monitored Patient Re-evaluated:Patient Re-evaluated prior to inductionOxygen Delivery Method: Circle System Utilized Preoxygenation: Pre-oxygenation with 100% oxygen Intubation Type: IV induction and Cricoid Pressure applied Ventilation: Mask ventilation without difficulty and Oral airway inserted - appropriate to patient size Laryngoscope Size: Mac and 3 Grade View: Grade I Tube type: Oral Tube size: 7.5 mm Number of attempts: 1 Airway Equipment and Method: stylet and LTA Placement Confirmation: ETT inserted through vocal cords under direct vision,  breath sounds checked- equal and bilateral,  positive ETCO2 and CO2 detector Secured at: 21 cm Tube secured with: Tape Dental Injury: Teeth and Oropharynx as per pre-operative assessment

## 2011-05-08 NOTE — ED Notes (Signed)
Pt alert, NAD, calm, interactive, Dr. Phoebe Perch (NSURG) at [facility]. Family at [facility].

## 2011-05-09 ENCOUNTER — Encounter (HOSPITAL_COMMUNITY): Payer: Self-pay | Admitting: Neurosurgery

## 2011-05-09 LAB — GLUCOSE, CAPILLARY
Glucose-Capillary: 133 mg/dL — ABNORMAL HIGH (ref 70–99)
Glucose-Capillary: 273 mg/dL — ABNORMAL HIGH (ref 70–99)

## 2011-05-09 MED ORDER — HYDROCHLOROTHIAZIDE 12.5 MG PO CAPS
12.5000 mg | ORAL_CAPSULE | Freq: Every day | ORAL | Status: DC
  Administered 2011-05-09 – 2011-05-12 (×2): 12.5 mg via ORAL
  Filled 2011-05-09 (×4): qty 1

## 2011-05-09 MED ORDER — RISPERIDONE 1 MG PO TABS
1.0000 mg | ORAL_TABLET | Freq: Every day | ORAL | Status: DC
  Administered 2011-05-09 – 2011-05-12 (×4): 1 mg via ORAL
  Filled 2011-05-09 (×4): qty 1

## 2011-05-09 MED ORDER — BISACODYL 10 MG RE SUPP: 10.0000 mg | Freq: Every day | RECTAL | Status: DC | PRN

## 2011-05-09 MED ORDER — ACETAMINOPHEN 325 MG PO TABS: 650.0000 mg | ORAL_TABLET | ORAL | Status: DC | PRN

## 2011-05-09 MED ORDER — INSULIN NPH ISOPHANE & REGULAR (70-30) 100 UNIT/ML ~~LOC~~ SUSP: 30.0000 [IU] | Freq: Two times a day (BID) | SUBCUTANEOUS | Status: DC

## 2011-05-09 MED ORDER — RISPERIDONE 2 MG PO TABS
2.0000 mg | ORAL_TABLET | Freq: Every day | ORAL | Status: DC
  Administered 2011-05-09 – 2011-05-11 (×3): 2 mg via ORAL
  Filled 2011-05-09 (×6): qty 1

## 2011-05-09 MED ORDER — CEFAZOLIN SODIUM 1-5 GM-% IV SOLN
1.0000 g | Freq: Three times a day (TID) | INTRAVENOUS | Status: AC
  Administered 2011-05-09 (×3): 1 g via INTRAVENOUS
  Filled 2011-05-09 (×3): qty 50

## 2011-05-09 MED ORDER — HYDROCODONE-ACETAMINOPHEN 5-325 MG PO TABS
1.0000 | ORAL_TABLET | ORAL | Status: DC | PRN
Start: 2011-05-09 — End: 2011-05-12
  Administered 2011-05-10 – 2011-05-11 (×3): 2 via ORAL
  Filled 2011-05-09 (×3): qty 2

## 2011-05-09 MED ORDER — MAGNESIUM HYDROXIDE 400 MG/5ML PO SUSP: 30.0000 mL | Freq: Every day | ORAL | Status: DC | PRN

## 2011-05-09 MED ORDER — PROMETHAZINE HCL 25 MG/ML IJ SOLN
12.5000 mg | INTRAMUSCULAR | Status: DC | PRN
Start: 2011-05-09 — End: 2011-05-12
  Filled 2011-05-09: qty 1

## 2011-05-09 MED ORDER — POTASSIUM CHLORIDE IN NACL 20-0.9 MEQ/L-% IV SOLN
INTRAVENOUS | Status: DC
  Administered 2011-05-09 – 2011-05-10 (×3): via INTRAVENOUS
  Filled 2011-05-09 (×9): qty 1000

## 2011-05-09 MED ORDER — ZOLPIDEM TARTRATE 10 MG PO TABS
10.0000 mg | ORAL_TABLET | Freq: Every evening | ORAL | Status: DC | PRN
  Administered 2011-05-10 – 2011-05-11 (×3): 10 mg via ORAL
  Filled 2011-05-09 (×3): qty 1

## 2011-05-09 MED ORDER — OXYCODONE HCL 5 MG PO TABS
5.0000 mg | ORAL_TABLET | ORAL | Status: DC | PRN
  Administered 2011-05-09 – 2011-05-12 (×15): 5 mg via ORAL
  Filled 2011-05-09 (×15): qty 1

## 2011-05-09 MED ORDER — ONDANSETRON HCL 4 MG/2ML IJ SOLN: 4.0000 mg | INTRAMUSCULAR | Status: DC | PRN

## 2011-05-09 MED ORDER — INSULIN ASPART PROT & ASPART (70-30 MIX) 100 UNIT/ML ~~LOC~~ SUSP
30.0000 [IU] | Freq: Every day | SUBCUTANEOUS | Status: DC
  Administered 2011-05-09 – 2011-05-10 (×2): 30 [IU] via SUBCUTANEOUS

## 2011-05-09 MED ORDER — RISPERIDONE 1 MG PO TABS
1.0000 mg | ORAL_TABLET | Freq: Two times a day (BID) | ORAL | Status: DC
  Filled 2011-05-09: qty 2

## 2011-05-09 MED ORDER — LISINOPRIL-HYDROCHLOROTHIAZIDE 10-12.5 MG PO TABS: 1.0000 | ORAL_TABLET | Freq: Every day | ORAL | Status: DC

## 2011-05-09 MED ORDER — SODIUM CHLORIDE 0.9 % IJ SOLN: 3.0000 mL | INTRAMUSCULAR | Status: DC | PRN

## 2011-05-09 MED ORDER — ACETAMINOPHEN 650 MG RE SUPP: 650.0000 mg | RECTAL | Status: DC | PRN

## 2011-05-09 MED ORDER — AMLODIPINE BESYLATE 10 MG PO TABS
10.0000 mg | ORAL_TABLET | Freq: Every day | ORAL | Status: DC
  Administered 2011-05-09 – 2011-05-12 (×2): 10 mg via ORAL
  Filled 2011-05-09 (×4): qty 1

## 2011-05-09 MED ORDER — FLEET ENEMA 7-19 GM/118ML RE ENEM: 1.0000 | ENEMA | Freq: Once | RECTAL | Status: AC | PRN

## 2011-05-09 MED ORDER — DOCUSATE SODIUM 100 MG PO CAPS
100.0000 mg | ORAL_CAPSULE | Freq: Two times a day (BID) | ORAL | Status: DC
  Administered 2011-05-09 – 2011-05-12 (×7): 100 mg via ORAL
  Filled 2011-05-09 (×8): qty 1

## 2011-05-09 MED ORDER — PROMETHAZINE HCL 25 MG PO TABS: 12.5000 mg | ORAL_TABLET | ORAL | Status: DC | PRN

## 2011-05-09 MED ORDER — OXYCODONE-ACETAMINOPHEN 5-325 MG PO TABS
1.0000 | ORAL_TABLET | ORAL | Status: DC | PRN
  Administered 2011-05-09 – 2011-05-12 (×16): 1 via ORAL
  Filled 2011-05-09 (×16): qty 1

## 2011-05-09 MED ORDER — MORPHINE SULFATE 4 MG/ML IJ SOLN
1.0000 mg | INTRAMUSCULAR | Status: DC | PRN
  Administered 2011-05-09: 2 mg via INTRAVENOUS
  Administered 2011-05-09 – 2011-05-10 (×6): 4 mg via INTRAVENOUS
  Administered 2011-05-12: 2 mg via INTRAVENOUS
  Filled 2011-05-09 (×8): qty 1

## 2011-05-09 MED ORDER — SODIUM CHLORIDE 0.9 % IJ SOLN
3.0000 mL | Freq: Two times a day (BID) | INTRAMUSCULAR | Status: DC
  Administered 2011-05-09 – 2011-05-11 (×3): 3 mL via INTRAVENOUS

## 2011-05-09 MED ORDER — KETOROLAC TROMETHAMINE 30 MG/ML IJ SOLN
30.0000 mg | Freq: Four times a day (QID) | INTRAMUSCULAR | Status: AC
  Administered 2011-05-09 (×4): 30 mg via INTRAVENOUS
  Filled 2011-05-09 (×5): qty 1

## 2011-05-09 MED ORDER — INSULIN ASPART PROT & ASPART (70-30 MIX) 100 UNIT/ML ~~LOC~~ SUSP
50.0000 [IU] | Freq: Every day | SUBCUTANEOUS | Status: DC
  Administered 2011-05-09 – 2011-05-12 (×3): 50 [IU] via SUBCUTANEOUS
  Filled 2011-05-09 (×2): qty 3

## 2011-05-09 MED ORDER — LISINOPRIL 10 MG PO TABS
10.0000 mg | ORAL_TABLET | Freq: Every day | ORAL | Status: DC
  Administered 2011-05-09 – 2011-05-12 (×2): 10 mg via ORAL
  Filled 2011-05-09 (×4): qty 1

## 2011-05-09 MED ORDER — SODIUM CHLORIDE 0.9 % IV SOLN: 250.0000 mL | INTRAVENOUS | Status: DC

## 2011-05-09 MED ORDER — DIAZEPAM 5 MG PO TABS
5.0000 mg | ORAL_TABLET | Freq: Four times a day (QID) | ORAL | Status: DC | PRN
  Administered 2011-05-09 – 2011-05-11 (×5): 5 mg via ORAL
  Filled 2011-05-09 (×6): qty 1

## 2011-05-09 MED ORDER — TIOTROPIUM BROMIDE MONOHYDRATE 18 MCG IN CAPS
18.0000 ug | ORAL_CAPSULE | Freq: Every day | RESPIRATORY_TRACT | Status: DC
  Administered 2011-05-09 – 2011-05-11 (×3): 18 ug via RESPIRATORY_TRACT
  Filled 2011-05-09 (×2): qty 5

## 2011-05-09 NOTE — Progress Notes (Signed)
Eval completed, pt will need a 3-n-1 and Carex tub seat with back ([facility] has). No follow-up OT needed. Will sign off.  Ignacia Palma, [place] [phone] 05/09/2011

## 2011-05-09 NOTE — Evaluation (Signed)
Physical Therapy Evaluation Patient Details Name: Julia [name] MRN: [id number] DOB: 1965-04-23 Today's Date: 05/09/2011  Problem List: There is no problem list on file for this patient.   Past Medical History:  Past Medical History  Diagnosis Date  . Hypertension   . Diabetes mellitus   . COPD (chronic obstructive pulmonary disease)   . Asthma   . Shortness of breath   . Headache   . Arthritis   . Bipolar affective disorder   . Fibromyalgia   . Cirrhosis of liver   . Anxiety    Past Surgical History:  Past Surgical History  Procedure Date  . Neck surgery   . Cesarean section   . Tonsillectomy   . Back surgery     x2  . Knee arthroscopy     L  . Lumbar laminectomy/decompression microdiscectomy 04/20/2011    Procedure: LUMBAR LAMINECTOMY/DECOMPRESSION MICRODISCECTOMY;  Surgeon: Karn Cassis, MD;  Location: [facility];  Service: Neurosurgery;  Laterality: Right;  Right Lumbar four- five Diskectomy    PT Assessment/Plan/Recommendation PT Assessment Clinical Impression Statement: Pt is 46 y/o female with recent admission for lumbar discectomy and now readmitted due to lumbar four - five recurant herniated nucleus pulposus, stenosis, cauda equina, fluid collection and redo discectomy.  Pt very motivated and willing to work.  Pt will benefit from acute PT services to improve overall mobility and to maximize independence to prepare for safe d/c home. PT Recommendation/Assessment: Patient will need skilled PT in the acute care venue PT Problem List: Decreased activity tolerance;Decreased balance;Decreased mobility;Decreased knowledge of use of DME;Decreased knowledge of precautions;Pain PT Therapy Diagnosis : Acute pain PT Plan PT Frequency: Min 5X/week PT Treatment/Interventions: DME instruction;Gait training;Stair training;Functional mobility training;Therapeutic exercise;Therapeutic activities;Patient/family education PT Recommendation Follow Up Recommendations: Home  health PT;Supervision/Assistance - 24 hour Equipment Recommended: None recommended by PT PT Goals  Acute Rehab PT Goals PT Goal Formulation: With patient Time For Goal Achievement: 7 days Pt will Roll Supine to Right Side: with modified independence PT Goal: Rolling Supine to Right Side - Progress: Goal set today Pt will go Supine/Side to Sit: with modified independence PT Goal: Supine/Side to Sit - Progress: Goal set today Pt will go Sit to Stand: with modified independence PT Goal: Sit to Stand - Progress: Goal set today Pt will go Stand to Sit: with modified independence PT Goal: Stand to Sit - Progress: Goal set today Pt will Transfer Bed to Chair/Chair to Bed: with modified independence PT Transfer Goal: Bed to Chair/Chair to Bed - Progress: Goal set today Pt will Ambulate: >150 feet;with supervision;with least restrictive assistive device PT Goal: Ambulate - Progress: Goal set today Pt will Go Up / Down Stairs: 3-5 stairs;with min assist;with least restrictive assistive device PT Goal: Up/Down Stairs - Progress: Goal set today  PT Evaluation Precautions/Restrictions  Precautions Precautions: Back Precaution Booklet Issued: Yes (comment) Precaution Comments: Reviewed 3/3 back precautions. Required Braces or Orthoses: No Restrictions Weight Bearing Restrictions: No Prior Functioning  Home Living Lives With: Son;Other (Comment) (going home with mother) Receives Help From: Family Type of Home: House Home Layout: One level Home Access: Stairs to enter Entrance Stairs-Rails: None Entrance Stairs-Number of Steps: 3 Bathroom Shower/Tub: Forensic scientist: Standard Bathroom Accessibility: Yes How Accessible: Accessible via walker Home Adaptive Equipment: Straight cane Prior Function Level of Independence: Independent with basic ADLs;Independent with homemaking with ambulation;Independent with gait;Independent with transfers Able to Take Stairs?:  Yes Driving: No Vocation: Unemployed Cognition Cognition Arousal/Alertness: Awake/alert Overall Cognitive  Status: Appears within functional limits for tasks assessed Orientation Level: Oriented X4 Sensation/Coordination Sensation Light Touch: Appears Intact Extremity Assessment RLE Assessment RLE Assessment: Within Functional Limits LLE Assessment LLE Assessment: Within Functional Limits Mobility (including Balance) Bed Mobility Bed Mobility: Yes Rolling Right: With rail;5: Supervision Rolling Right Details (indicate cue type and reason): (A) to initiate rolling with cues for technique to prevent twisting. Right Sidelying to Sit: 4: Min assist;HOB flat;With rails (minguard) Right Sidelying to Sit Details (indicate cue type and reason): VCs for technique with cues for technique.  Minguard for safety Transfers Transfers: Yes Sit to Stand: 4: Min assist;From bed;From elevated surface Sit to Stand Details (indicate cue type and reason): (A) to initiate transfer with cues for hand placement Stand to Sit: 4: Min assist;To chair/3-in-1 Stand to Sit Details: (A) to slowly transfer with cues for technique for hand placement. Ambulation/Gait Ambulation/Gait: Yes Ambulation/Gait Assistance: 5: Supervision Ambulation/Gait Assistance Details (indicate cue type and reason): VCs for technique to stand upright and cues for RW placement. Ambulation Distance (Feet): 75 Feet Assistive device: Rolling walker Gait Pattern: Step-to pattern;Decreased stride length Gait velocity: Decreased gait speed Stairs: No  Balance Balance Assessed: Yes Static Sitting Balance Static Sitting - Balance Support: Feet supported Static Sitting - Level of Assistance: 5: Stand by assistance Static Sitting - Comment/# of Minutes: Supervision for safety Exercise    End of Session PT - End of Session Equipment Utilized During Treatment: Gait belt Activity Tolerance: Patient tolerated treatment well Patient left:  in chair;with call bell in reach Nurse Communication: Mobility status for transfers;Mobility status for ambulation General Behavior During Session: [facility] for tasks performed Cognition: [facility] for tasks performed  Julia [name] 05/09/2011, 1:58 PM [phone]

## 2011-05-09 NOTE — Evaluation (Signed)
Occupational Therapy Evaluation Patient Details Name: Julia [name] MRN: [id number] DOB: 04/05/1965 Today's Date: 05/09/2011 1:10-1:58  Problem List: There is no problem list on file for this patient.   Past Medical History:  Past Medical History  Diagnosis Date  . Hypertension   . Diabetes mellitus   . COPD (chronic obstructive pulmonary disease)   . Asthma   . Shortness of breath   . Headache   . Arthritis   . Bipolar affective disorder   . Fibromyalgia   . Cirrhosis of liver   . Anxiety    Past Surgical History:  Past Surgical History  Procedure Date  . Neck surgery   . Cesarean section   . Tonsillectomy   . Back surgery     x2  . Knee arthroscopy     L  . Lumbar laminectomy/decompression microdiscectomy 04/20/2011    Procedure: LUMBAR LAMINECTOMY/DECOMPRESSION MICRODISCECTOMY;  Surgeon: Karn Cassis, MD;  Location: [facility];  Service: Neurosurgery;  Laterality: Right;  Right Lumbar four- five Diskectomy  . Lumbar laminectomy/decompression microdiscectomy 05/08/2011    Procedure: LUMBAR LAMINECTOMY/DECOMPRESSION MICRODISCECTOMY 1 LEVEL;  Surgeon: Clydene Fake, MD;  Location: [facility];  Service: Neurosurgery;  Laterality: N/A;  Lumbar four-five, Redo Lumbar Laminectomy, Repair of CSF Leak    OT Assessment/Plan/Recommendation OT Assessment Clinical Impression Statement: This 46 yo female s/p redo for lumbar diskectomy due to cauda equina syndrome presents to acute OT at a set-up/S  level to Mod I level. All education completed with pt and mother, no further OT needs, will sign off. OT Recommendation/Assessment: Patient does not need any further OT services OT Recommendation Follow Up Recommendations: No OT follow up Equipment Recommended: 3 in 1 bedside comode;Tub/shower seat (tub seat--Carex from [facility]) Individuals Consulted Consulted and Agree with Results and Recommendations: Patient;Family member/caregiver Family Member Consulted:  mother OT Goals    OT Evaluation Precautions/Restrictions  Precautions Precautions: Back Precaution Booklet Issued: Yes (comment) Precaution Comments: Reviewed back precautions and pt able to verbalize all  Required Braces or Orthoses: No Restrictions Weight Bearing Restrictions: No Prior Functioning Home Living Lives With: Other (Comment) (going home with mother) Receives Help From: Family Type of Home: House Home Layout: One level Home Access: Stairs to enter Entrance Stairs-Rails: None Entrance Stairs-Number of Steps: 3 Bathroom Shower/Tub: Tub/shower unit;Curtain (At mothers house there is also Tourist information centre manager) Firefighter: Standard Bathroom Accessibility: Yes How Accessible: Accessible via walker Home Adaptive Equipment: Straight cane Prior Function Level of Independence: Independent with basic ADLs;Independent with homemaking with ambulation;Independent with gait;Independent with transfers Able to Take Stairs?: Yes Driving: No Vocation: Unemployed ADL ADL Eating/Feeding: Simulated;Independent Where Assessed - Eating/Feeding: Edge of bed Grooming: Simulated;Set up Where Assessed - Grooming: Sitting, bed Upper Body Bathing: Simulated;Set up Where Assessed - Upper Body Bathing: Unsupported;Sitting, bed Lower Body Bathing: Simulated;Modified independent;Other (comment) (crosses one leg over the other to reach lower leg and foot) Where Assessed - Lower Body Bathing: Unsupported;Sitting, bed Upper Body Dressing: Simulated;Set up Where Assessed - Upper Body Dressing: Sitting, bed;Unsupported Lower Body Dressing: Performed;Set up Where Assessed - Lower Body Dressing: Unsupported;Sitting, bed Toilet Transfer: Performed;Modified independent Toilet Transfer Method: Proofreader: Raised toilet seat with arms (or 3-in-1 over toilet) Toileting - Clothing Manipulation: Performed;Independent;Other (comment) (one gown) Where Assessed - Toileting  Clothing Manipulation: Standing Toileting - Hygiene: Simulated;Independent Where Assessed - Toileting Hygiene: Standing Tub/Shower Transfer: Engineer, manufacturing Details (indicate cue type and reason): side step into/out of tub to sit on tub  seat with back Tub/Shower Transfer Method: Science writer: Shower seat with back Equipment Used: Rolling walker Ambulation Related to ADLs: Mod I ADL Comments: Issued pt a Engineer, water Arousal/Alertness: Awake/alert Overall Cognitive Status: Appears within functional limits for tasks assessed Orientation Level: Oriented X4 Sensation/Coordination Sensation Light Touch: Appears Intact Extremity Assessment RUE Assessment RUE Assessment: Within Functional Limits LUE Assessment LUE Assessment: Within Functional Limits Mobility  Bed Mobility Bed Mobility: Yes Rolling Right: 5: Supervision;Other (comment) (no rail, VCs for technique) Rolling Right Details (indicate cue type and reason): (A) to initiate rolling with cues for technique to prevent twisting. Right Sidelying to Sit: 5: Supervision;HOB flat;Other (comment) (no rail, VCs for technique) Right Sidelying to Sit Details (indicate cue type and reason): VCs for technique with cues for technique.  Minguard for safety Sitting - Scoot to Edge of Bed: 7: Independent Sit to Sidelying Right: 5: Supervision;HOB flat;Other (comment) (NO rail, VCs for technique) Transfers Transfers: Yes Sit to Stand: 5: Supervision;With upper extremity assist;From bed (VCs for hand placement) Sit to Stand Details (indicate cue type and reason): (A) to initiate transfer with cues for hand placement Stand to Sit: 5: Supervision;With upper extremity assist;To bed (VC's for hand placement) Stand to Sit Details: (A) to slowly transfer with cues for technique for hand placement. Exercises   End of Session OT - End of  Session Equipment Utilized During Treatment: Other (comment) (RW) Activity Tolerance: Patient tolerated treatment well Patient left: in bed;with call bell in reach;with family/visitor present (mother) General Behavior During Session: [facility] for tasks performed Cognition: [facility] for tasks performed   Evette Georges [phone] 05/09/2011, 5:08 PM

## 2011-05-09 NOTE — Anesthesia Postprocedure Evaluation (Signed)
  Anesthesia Post-op Note  Patient: Julia [name]  Procedure(s) Performed: Procedure(s) (LRB): LUMBAR LAMINECTOMY/DECOMPRESSION MICRODISCECTOMY 1 LEVEL (N/A)  Patient Location: PACU  Anesthesia Type: General  Level of Consciousness: awake  Airway and Oxygen Therapy: Patient Spontanous Breathing  Post-op Pain: mild  Post-op Assessment: Post-op Vital signs reviewed, Patient's Cardiovascular Status Stable, Respiratory Function Stable, Patent Airway, No signs of Nausea or vomiting and Pain level controlled  Post-op Vital Signs: stable  Complications: No apparent anesthesia complications

## 2011-05-09 NOTE — Progress Notes (Signed)
Informed Dr.Botero about the slight bleeding from the surgical site.Have been told that, there would be slight bleeding because she had multiple back surgeries.   Did an over dressing on the site.

## 2011-05-09 NOTE — Progress Notes (Signed)
Informed Dr.Botero about the bleeding(looks little oozing) .Asked to change the dressing,I have changed the dressing with all aseptic techniques . The site is stapled and looks getting dry. Pt is stable.

## 2011-05-10 LAB — URINE CULTURE: Culture: NO GROWTH

## 2011-05-10 LAB — GLUCOSE, CAPILLARY
Glucose-Capillary: 156 mg/dL — ABNORMAL HIGH (ref 70–99)
Glucose-Capillary: 105 mg/dL — ABNORMAL HIGH (ref 70–99)
Glucose-Capillary: 33 mg/dL — CL (ref 70–99)
Glucose-Capillary: 134 mg/dL — ABNORMAL HIGH (ref 70–99)

## 2011-05-10 LAB — WOUND CULTURE: Gram Stain: NONE SEEN

## 2011-05-10 NOTE — Progress Notes (Signed)
CBG: 33  Treatment: 15 GM carbohydrate snack  Symptoms: Shaky  Follow-up CBG: Time:1215 CBG Result:105  Possible Reasons for Event: Medication regimen: Novolog Mix may be too much  Comments/MD notified:Will continue to monitor. I will write sticky note for MD to see.     Carman Ching, RN

## 2011-05-10 NOTE — Progress Notes (Signed)
Physical Therapy Treatment Patient Details Name: Julia [name] MRN: [id number] DOB: 1965-03-25 Today's Date: 05/10/2011  PT Assessment/Plan  PT - Assessment/Plan Comments on Treatment Session: Pt continues to show great progress and able to increase ambulation distance.  Pt able to perform stair negotiation using on rail.  Plan to practice steps again without rail.   PT Plan: Discharge plan remains appropriate;Frequency remains appropriate PT Frequency: Min 5X/week Follow Up Recommendations: Home health PT;Supervision/Assistance - 24 hour Equipment Recommended: 3 in 1 bedside comode;Tub/shower seat PT Goals  Acute Rehab PT Goals PT Goal Formulation: With patient Time For Goal Achievement: 7 days Pt will Roll Supine to Right Side: with modified independence PT Goal: Rolling Supine to Right Side - Progress: Progressing toward goal Pt will go Supine/Side to Sit: with modified independence PT Goal: Supine/Side to Sit - Progress: Progressing toward goal Pt will go Sit to Stand: with modified independence PT Goal: Sit to Stand - Progress: Progressing toward goal Pt will go Stand to Sit: with modified independence PT Goal: Stand to Sit - Progress: Progressing toward goal Pt will Transfer Bed to Chair/Chair to Bed: with modified independence PT Transfer Goal: Bed to Chair/Chair to Bed - Progress: Progressing toward goal Pt will Ambulate: >150 feet;with supervision;with least restrictive assistive device PT Goal: Ambulate - Progress: Progressing toward goal Pt will Go Up / Down Stairs: 3-5 stairs;with min assist;with least restrictive assistive device PT Goal: Up/Down Stairs - Progress: Progressing toward goal  PT Treatment Precautions/Restrictions  Precautions Precautions: Back Precaution Booklet Issued: Yes (comment) Precaution Comments: Pt able to recall 3/3 back precautions and needs intermittent cues to prevent twisting. Required Braces or Orthoses: No Restrictions Weight Bearing  Restrictions: No Mobility (including Balance) Bed Mobility Bed Mobility: Yes Rolling Right: 6: Modified independent (Device/Increase time) Rolling Right Details (indicate cue type and reason): Pt needed to use rail to assist with rolling Right Sidelying to Sit: 5: Supervision;HOB flat;Other (comment) Right Sidelying to Sit Details (indicate cue type and reason): Supervision for safety with VCs for safe technique Transfers Transfers: Yes Sit to Stand: 5: Supervision;With upper extremity assist;From bed Sit to Stand Details (indicate cue type and reason): VCs for hand placement Ambulation/Gait Ambulation/Gait: Yes Ambulation/Gait Assistance: 5: Supervision Ambulation/Gait Assistance Details (indicate cue type and reason): VCs for technique to stand upright and cues for RW placement Ambulation Distance (Feet): 150 Feet Assistive device: Rolling walker Gait Pattern: Step-to pattern;Decreased stride length Stairs: Yes Stairs Assistance: 4: Min assist (minguard for safety) Stairs Assistance Details (indicate cue type and reason): Minguard for safety with cues for step sequence Stair Management Technique: One rail Right;Forwards Number of Stairs: 3   Balance Balance Assessed: Yes Static Sitting Balance Static Sitting - Balance Support: Feet supported Static Sitting - Level of Assistance: 5: Stand by assistance Exercise    End of Session PT - End of Session Equipment Utilized During Treatment: Gait belt Activity Tolerance: Patient tolerated treatment well Patient left: in chair;with call bell in reach Nurse Communication: Mobility status for transfers;Mobility status for ambulation General Behavior During Session: [facility] for tasks performed Cognition: [facility] for tasks performed  Seymour Pavlak 05/10/2011, 12:20 PM [phone]

## 2011-05-10 NOTE — Progress Notes (Signed)
Utilization review completed. Johnny Gorter, RN, BSN. 05/10/11 

## 2011-05-10 NOTE — Progress Notes (Signed)
Afebril. Decrease of both legs pain. Foley out.still small amount of bloody drainage. Continue with pt/ot.see orders.

## 2011-05-11 LAB — GLUCOSE, CAPILLARY: Glucose-Capillary: 140 mg/dL — ABNORMAL HIGH (ref 70–99)

## 2011-05-11 NOTE — Progress Notes (Signed)
Physical Therapy Treatment Patient Details Name: Julia [name] MRN: [id number] DOB: 18-Sep-1965 Today's Date: 05/11/2011  PT Assessment/Plan  PT - Assessment/Plan Comments on Treatment Session: Pt able to perform stairs with HHA and forward without rail and VCs.  Pt able to recall and adhere to 3/3 back preacautions.  Will continue to follow to make sure goals are maintained.  Pt will need RW at d/c. PT Plan: Discharge plan remains appropriate;Frequency remains appropriate PT Frequency: Min 5X/week Follow Up Recommendations: Home health PT;Supervision/Assistance - 24 hour Equipment Recommended: Rolling walker with 5" wheels PT Goals  Acute Rehab PT Goals PT Goal Formulation: With patient Time For Goal Achievement: 7 days Pt will Roll Supine to Right Side: with modified independence PT Goal: Rolling Supine to Right Side - Progress: Met Pt will go Supine/Side to Sit: with modified independence PT Goal: Supine/Side to Sit - Progress: Met Pt will go Sit to Stand: with modified independence PT Goal: Sit to Stand - Progress: Progressing toward goal Pt will go Stand to Sit: with modified independence PT Goal: Stand to Sit - Progress: Progressing toward goal Pt will Transfer Bed to Chair/Chair to Bed: with modified independence PT Transfer Goal: Bed to Chair/Chair to Bed - Progress: Met Pt will Ambulate: >150 feet;with supervision;with least restrictive assistive device PT Goal: Ambulate - Progress: Met Pt will Go Up / Down Stairs: 3-5 stairs;with min assist;with least restrictive assistive device PT Goal: Up/Down Stairs - Progress: Met  PT Treatment Precautions/Restrictions  Precautions Precautions: Back Precaution Booklet Issued: Yes (comment) Precaution Comments: Pt able to recall 3/3 back precautions and needs intermittent cues to prevent twisting. Required Braces or Orthoses: No Restrictions Weight Bearing Restrictions: No Mobility (including Balance) Bed Mobility Bed Mobility:  Yes Rolling Right: 6: Modified independent (Device/Increase time) Right Sidelying to Sit: 6: Modified independent (Device/Increase time);HOB flat;With rails Transfers Transfers: Yes Sit to Stand: 5: Supervision;With upper extremity assist;From bed Sit to Stand Details (indicate cue type and reason): VCs for hand placement.  Pt continues to hold onto RW with transfers. Stand to Sit: 5: Supervision;With upper extremity assist;To bed Stand to Sit Details: VCs for hand placement Ambulation/Gait Ambulation/Gait: Yes Ambulation/Gait Assistance: 6: Modified independent (Device/Increase time) Ambulation Distance (Feet): 200 Feet Assistive device: Rolling walker Gait Pattern: Step-through pattern Stairs: Yes Stairs Assistance: 4: Min assist Stairs Assistance Details (indicate cue type and reason): Pt able to perform steps with HHA without VCs. Stair Management Technique: Forwards Number of Stairs: 2   Posture/Postural Control Posture/Postural Control: No significant limitations Exercise    End of Session PT - End of Session Equipment Utilized During Treatment: Gait belt Activity Tolerance: Patient tolerated treatment well Patient left: in chair;with call bell in reach Nurse Communication: Mobility status for transfers;Mobility status for ambulation General Behavior During Session: [facility] for tasks performed Cognition: [facility] for tasks performed  Julia [name] 05/11/2011, 9:22 AM [phone]

## 2011-05-11 NOTE — Progress Notes (Signed)
Patient ID: Julia [name], female   DOB: March 17, 1966, 46 y.o.   MRN: [id number] Afebril. No ha. Some pain in right thigh. Wound with some serous-bloody drainage. Continue pt/ot. Patient to go home once the drainage is resolved.

## 2011-05-12 LAB — GLUCOSE, CAPILLARY
Glucose-Capillary: 177 mg/dL — ABNORMAL HIGH (ref 70–99)
Glucose-Capillary: 131 mg/dL — ABNORMAL HIGH (ref 70–99)

## 2011-05-12 NOTE — Progress Notes (Signed)
PT Cancellation Note  Treatment cancelled today due to pt discharging.  However, spoke pt's mother about discharge plans and progressing off RW.  Pt had no further questions and dressed ready to d/c home.   Julia [name] 05/12/2011, 1:23 PM Pager:  [phone]

## 2011-05-12 NOTE — Progress Notes (Signed)
Pt given D/C instructions with understanding. Pt received home equipment prior to D/C. Pt D/C'd home via wheelchair with family @ 1045 per MD order. Rema Fendt, RN

## 2011-05-12 NOTE — Progress Notes (Signed)
Patient ID: Julia [name], female   DOB: 1965/09/04, 46 y.o.   MRN: [id number] Patient to go home she will be taken her preop medications. She is to talk to her md. Oxycodone and valium given

## 2011-05-12 NOTE — Discharge Summary (Signed)
Physician Discharge Summary  Patient ID: Julia [name] MRN: [id number] DOB/AGE: [birth year] 48 y.o.  Admit date: 05/08/2011 Discharge date: 05/12/2011  Admission Diagnoses:recurrent  Herniated disc lumbar  Discharge Diagnoses: same Active Problems:  * No active hospital problems. *    Discharged Condition:stable. Small amount of drainage. Minimal right leg pain  Hospital Course: brought to the hospital  With recurrent back and leg pain.taken to surgery by dr hirsch on feb 17.   Consults:none  Significant Diagnostic Studies: mri lumbar  Treatments: surgery feb 17/13  Discharge Exam: Blood pressure 134/91, pulse 70, temperature 97.8 F (36.6 C), temperature source Oral, resp. rate 18, height 5\' 5"  (1.651 m), weight 131.997 kg (291 lb), last menstrual period 04/18/2011, SpO2 96.00%. Afebril. Small drainage in lumbar area. Wants to go home. She will stay with parents  Disposition: 01-Home or Self Care   Medication List  As of 05/12/2011  9:06 AM   ASK your doctor about these medications         amLODipine 10 MG tablet   Commonly known as: NORVASC   Take 10 mg by mouth daily.      insulin NPH-insulin regular (70-30) 100 UNIT/ML injection   Commonly known as: NOVOLIN 70/30   Inject 30-50 Units into the skin 2 (two) times daily with a meal. 50 units in the morning; 30 units in the evening      lisinopril-hydrochlorothiazide 10-12.5 MG per tablet   Commonly known as: PRINZIDE,ZESTORETIC   Take 1 tablet by mouth daily.      oxyCODONE-acetaminophen 10-325 MG per tablet   Commonly known as: PERCOCET   Take 1 tablet by mouth every 4 (four) hours as needed.      risperiDONE 1 MG tablet   Commonly known as: RISPERDAL   Take 1-2 mg by mouth 2 (two) times daily. 1mg  in the morning; 2mg  in the evening      tiotropium 18 MCG inhalation capsule   Commonly known as: SPIRIVA   Place 18 mcg into inhaler and inhale daily.             Signed: Karn Cassis 05/12/2011, 9:06  AM

## 2011-05-13 LAB — ANAEROBIC CULTURE

## 2011-05-19 ENCOUNTER — Other Ambulatory Visit: Payer: Self-pay | Admitting: Neurosurgery

## 2011-05-19 ENCOUNTER — Other Ambulatory Visit (HOSPITAL_COMMUNITY): Payer: Self-pay | Admitting: *Deleted

## 2011-05-19 MED ORDER — CEFAZOLIN SODIUM-DEXTROSE 2-3 GM-% IV SOLR
2.0000 g | INTRAVENOUS | Status: AC
  Administered 2011-05-20: 2 g via INTRAVENOUS
  Filled 2011-05-19: qty 50

## 2011-05-20 ENCOUNTER — Encounter (HOSPITAL_COMMUNITY): Payer: Self-pay | Admitting: Certified Registered"

## 2011-05-20 ENCOUNTER — Inpatient Hospital Stay (HOSPITAL_COMMUNITY): Payer: Medicaid Other

## 2011-05-20 ENCOUNTER — Inpatient Hospital Stay (HOSPITAL_COMMUNITY)
Admission: RE | Admit: 2011-05-20 | Discharge: 2011-05-25 | DRG: 863 | Disposition: A | Discharge status: Home Health | Payer: Medicaid Other | Source: Ambulatory Visit | Attending: Neurosurgery | Admitting: Neurosurgery

## 2011-05-20 ENCOUNTER — Encounter (HOSPITAL_COMMUNITY)
Admission: RE | Disposition: A | Discharge status: Home Health | Payer: Self-pay | Source: Ambulatory Visit | Attending: Neurosurgery

## 2011-05-20 ENCOUNTER — Encounter (HOSPITAL_COMMUNITY): Payer: Self-pay | Admitting: *Deleted

## 2011-05-20 ENCOUNTER — Ambulatory Visit (HOSPITAL_COMMUNITY): Payer: Medicaid Other | Admitting: Certified Registered"

## 2011-05-20 DIAGNOSIS — Z888 Allergy status to other drugs, medicaments and biological substances status: Secondary | ICD-10-CM

## 2011-05-20 DIAGNOSIS — IMO0002 Reserved for concepts with insufficient information to code with codable children: Secondary | ICD-10-CM | POA: Diagnosis present

## 2011-05-20 DIAGNOSIS — Z9889 Other specified postprocedural states: Secondary | ICD-10-CM

## 2011-05-20 DIAGNOSIS — J4489 Other specified chronic obstructive pulmonary disease: Secondary | ICD-10-CM | POA: Diagnosis present

## 2011-05-20 DIAGNOSIS — I10 Essential (primary) hypertension: Secondary | ICD-10-CM

## 2011-05-20 DIAGNOSIS — M129 Arthropathy, unspecified: Secondary | ICD-10-CM | POA: Diagnosis present

## 2011-05-20 DIAGNOSIS — Y838 Other surgical procedures as the cause of abnormal reaction of the patient, or of later complication, without mention of misadventure at the time of the procedure: Secondary | ICD-10-CM | POA: Diagnosis present

## 2011-05-20 DIAGNOSIS — F319 Bipolar disorder, unspecified: Secondary | ICD-10-CM | POA: Diagnosis present

## 2011-05-20 DIAGNOSIS — R05 Cough: Secondary | ICD-10-CM | POA: Clinically undetermined

## 2011-05-20 DIAGNOSIS — T8140XA Infection following a procedure, unspecified, initial encounter: Principal | ICD-10-CM | POA: Diagnosis present

## 2011-05-20 DIAGNOSIS — Z79899 Other long term (current) drug therapy: Secondary | ICD-10-CM

## 2011-05-20 DIAGNOSIS — Z794 Long term (current) use of insulin: Secondary | ICD-10-CM

## 2011-05-20 DIAGNOSIS — F17200 Nicotine dependence, unspecified, uncomplicated: Secondary | ICD-10-CM | POA: Diagnosis present

## 2011-05-20 DIAGNOSIS — R059 Cough, unspecified: Secondary | ICD-10-CM | POA: Diagnosis present

## 2011-05-20 DIAGNOSIS — F411 Generalized anxiety disorder: Secondary | ICD-10-CM | POA: Diagnosis present

## 2011-05-20 DIAGNOSIS — K7030 Alcoholic cirrhosis of liver without ascites: Secondary | ICD-10-CM | POA: Diagnosis present

## 2011-05-20 DIAGNOSIS — IMO0001 Reserved for inherently not codable concepts without codable children: Secondary | ICD-10-CM | POA: Diagnosis present

## 2011-05-20 DIAGNOSIS — E119 Type 2 diabetes mellitus without complications: Secondary | ICD-10-CM | POA: Diagnosis present

## 2011-05-20 DIAGNOSIS — J449 Chronic obstructive pulmonary disease, unspecified: Secondary | ICD-10-CM | POA: Diagnosis present

## 2011-05-20 HISTORY — DX: Essential (primary) hypertension: I10

## 2011-05-20 HISTORY — DX: Other specified postprocedural states: Z98.890

## 2011-05-20 HISTORY — PX: LUMBAR WOUND DEBRIDEMENT: SHX1988

## 2011-05-20 LAB — COMPREHENSIVE METABOLIC PANEL
ALT: 31 U/L (ref 0–35)
Alkaline Phosphatase: 52 U/L (ref 39–117)
BUN: 11 mg/dL (ref 6–23)
Chloride: 91 mEq/L — ABNORMAL LOW (ref 96–112)
GFR calc Af Amer: >90 mL/min (ref >90)
Glucose, Bld: 92 mg/dL (ref 70–99)
Potassium: 3.9 mEq/L (ref 3.5–5.1)
Total Bilirubin: 0.6 mg/dL (ref 0.3–1.2)

## 2011-05-20 LAB — SURGICAL PCR SCREEN
MRSA, PCR: NEGATIVE
Staphylococcus aureus: NEGATIVE

## 2011-05-20 LAB — CBC
HCT: 35 % — ABNORMAL LOW (ref 36.0–46.0)
MCHC: 34.3 g/dL (ref 30.0–36.0)
MCV: 92.6 fL (ref 78.0–100.0)
Platelets: 120 10*3/uL — ABNORMAL LOW (ref 150–400)
RDW: 14.6 % (ref 11.5–15.5)
WBC: 10.7 10*3/uL — ABNORMAL HIGH (ref 4.0–10.5)

## 2011-05-20 LAB — GLUCOSE, CAPILLARY
Glucose-Capillary: 89 mg/dL (ref 70–99)
Glucose-Capillary: 128 mg/dL — ABNORMAL HIGH (ref 70–99)
Glucose-Capillary: 102 mg/dL — ABNORMAL HIGH (ref 70–99)

## 2011-05-20 SURGERY — LUMBAR WOUND DEBRIDEMENT
Anesthesia: General | Site: Back | Wound class: Dirty or Infected

## 2011-05-20 MED ORDER — HYDROMORPHONE HCL PF 1 MG/ML IJ SOLN
0.2500 mg | INTRAMUSCULAR | Status: DC | PRN
  Administered 2011-05-20 (×2): 0.5 mg via INTRAVENOUS

## 2011-05-20 MED ORDER — MEPERIDINE HCL 25 MG/ML IJ SOLN: 6.2500 mg | INTRAMUSCULAR | Status: DC | PRN

## 2011-05-20 MED ORDER — MENTHOL 3 MG MT LOZG: 1.0000 | LOZENGE | OROMUCOSAL | Status: DC | PRN

## 2011-05-20 MED ORDER — INSULIN NPH (HUMAN) (ISOPHANE) 100 UNIT/ML ~~LOC~~ SUSP
50.0000 [IU] | Freq: Every day | SUBCUTANEOUS | Status: DC
  Administered 2011-05-21 – 2011-05-24 (×4): 50 [IU] via SUBCUTANEOUS
  Filled 2011-05-20: qty 10

## 2011-05-20 MED ORDER — SODIUM CHLORIDE 0.9 % IV SOLN: 250.0000 mL | INTRAVENOUS | Status: DC

## 2011-05-20 MED ORDER — HYDROMORPHONE HCL PF 1 MG/ML IJ SOLN
INTRAMUSCULAR | Status: AC
  Filled 2011-05-20: qty 1

## 2011-05-20 MED ORDER — FLUTICASONE-SALMETEROL 250-50 MCG/DOSE IN AEPB
1.0000 | INHALATION_SPRAY | Freq: Two times a day (BID) | RESPIRATORY_TRACT | Status: DC
  Filled 2011-05-20: qty 14

## 2011-05-20 MED ORDER — RISPERIDONE 1 MG PO TABS
1.0000 mg | ORAL_TABLET | Freq: Every day | ORAL | Status: DC
  Administered 2011-05-21 – 2011-05-25 (×5): 1 mg via ORAL
  Filled 2011-05-20 (×5): qty 1

## 2011-05-20 MED ORDER — HYDROCHLOROTHIAZIDE 12.5 MG PO CAPS
12.5000 mg | ORAL_CAPSULE | Freq: Every day | ORAL | Status: DC
  Administered 2011-05-22 – 2011-05-25 (×4): 12.5 mg via ORAL
  Filled 2011-05-20 (×5): qty 1

## 2011-05-20 MED ORDER — SODIUM CHLORIDE 0.9 % IJ SOLN: 3.0000 mL | Freq: Two times a day (BID) | INTRAMUSCULAR | Status: DC

## 2011-05-20 MED ORDER — NICOTINE 14 MG/24HR TD PT24
14.0000 mg | MEDICATED_PATCH | Freq: Every day | TRANSDERMAL | Status: DC
  Administered 2011-05-20 – 2011-05-25 (×6): 14 mg via TRANSDERMAL
  Filled 2011-05-20 (×6): qty 1

## 2011-05-20 MED ORDER — OXYCODONE HCL 5 MG PO TABS
15.0000 mg | ORAL_TABLET | Freq: Once | ORAL | Status: AC
  Administered 2011-05-20: 15 mg via ORAL
  Filled 2011-05-20: qty 3

## 2011-05-20 MED ORDER — RISPERIDONE 1 MG PO TABS
1.0000 mg | ORAL_TABLET | Freq: Every day | ORAL | Status: DC
  Filled 2011-05-20 (×2): qty 1

## 2011-05-20 MED ORDER — 0.9 % SODIUM CHLORIDE (POUR BTL) OPTIME
TOPICAL | Status: DC | PRN
  Administered 2011-05-20: 1000 mL

## 2011-05-20 MED ORDER — SODIUM CHLORIDE 0.9 % IJ SOLN
3.0000 mL | Freq: Two times a day (BID) | INTRAMUSCULAR | Status: DC
  Administered 2011-05-21: 3 mL via INTRAVENOUS

## 2011-05-20 MED ORDER — ACETAMINOPHEN 650 MG RE SUPP: 650.0000 mg | RECTAL | Status: DC | PRN

## 2011-05-20 MED ORDER — ZOLPIDEM TARTRATE 10 MG PO TABS
10.0000 mg | ORAL_TABLET | Freq: Every evening | ORAL | Status: DC | PRN
  Administered 2011-05-21 – 2011-05-22 (×2): 10 mg via ORAL
  Filled 2011-05-20 (×2): qty 1

## 2011-05-20 MED ORDER — CEFAZOLIN SODIUM 1-5 GM-% IV SOLN: 1.0000 g | Freq: Three times a day (TID) | INTRAVENOUS | Status: DC

## 2011-05-20 MED ORDER — RISPERIDONE 2 MG PO TABS
2.0000 mg | ORAL_TABLET | Freq: Every day | ORAL | Status: DC
  Administered 2011-05-20 – 2011-05-24 (×5): 2 mg via ORAL
  Filled 2011-05-20 (×6): qty 1

## 2011-05-20 MED ORDER — ACETAMINOPHEN 325 MG PO TABS
650.0000 mg | ORAL_TABLET | ORAL | Status: DC | PRN
  Filled 2011-05-20: qty 2

## 2011-05-20 MED ORDER — DIAZEPAM 5 MG PO TABS
5.0000 mg | ORAL_TABLET | Freq: Four times a day (QID) | ORAL | Status: DC | PRN
  Administered 2011-05-21 – 2011-05-25 (×14): 5 mg via ORAL
  Filled 2011-05-20 (×14): qty 1

## 2011-05-20 MED ORDER — ZOLPIDEM TARTRATE 10 MG PO TABS: 10.0000 mg | ORAL_TABLET | Freq: Every evening | ORAL | Status: DC | PRN

## 2011-05-20 MED ORDER — FENTANYL CITRATE 0.05 MG/ML IJ SOLN
INTRAMUSCULAR | Status: DC | PRN
  Administered 2011-05-20 (×2): 50 ug via INTRAVENOUS
  Administered 2011-05-20: 100 ug via INTRAVENOUS

## 2011-05-20 MED ORDER — MORPHINE SULFATE 4 MG/ML IJ SOLN: 4.0000 mg | INTRAMUSCULAR | Status: DC | PRN

## 2011-05-20 MED ORDER — BACITRACIN ZINC 500 UNIT/GM EX OINT
TOPICAL_OINTMENT | CUTANEOUS | Status: DC | PRN
  Administered 2011-05-20: 1 via TOPICAL

## 2011-05-20 MED ORDER — SUCCINYLCHOLINE CHLORIDE 20 MG/ML IJ SOLN
INTRAMUSCULAR | Status: DC | PRN
  Administered 2011-05-20: 100 mg via INTRAVENOUS

## 2011-05-20 MED ORDER — BENZONATATE 100 MG PO CAPS
200.0000 mg | ORAL_CAPSULE | Freq: Three times a day (TID) | ORAL | Status: DC
  Administered 2011-05-20 – 2011-05-25 (×14): 200 mg via ORAL
  Filled 2011-05-20 (×18): qty 2

## 2011-05-20 MED ORDER — HEMOSTATIC AGENTS (NO CHARGE) OPTIME
TOPICAL | Status: DC | PRN
  Administered 2011-05-20: 1 via TOPICAL

## 2011-05-20 MED ORDER — TIOTROPIUM BROMIDE MONOHYDRATE 18 MCG IN CAPS
18.0000 ug | ORAL_CAPSULE | Freq: Every day | RESPIRATORY_TRACT | Status: DC
  Filled 2011-05-20: qty 5

## 2011-05-20 MED ORDER — MUPIROCIN 2 % EX OINT
TOPICAL_OINTMENT | Freq: Two times a day (BID) | CUTANEOUS | Status: DC
  Administered 2011-05-20 – 2011-05-24 (×3): via NASAL
  Filled 2011-05-20 (×6): qty 22

## 2011-05-20 MED ORDER — PHENOL 1.4 % MT LIQD: 1.0000 | OROMUCOSAL | Status: DC | PRN

## 2011-05-20 MED ORDER — MORPHINE SULFATE 4 MG/ML IJ SOLN: 0.0500 mg/kg | INTRAMUSCULAR | Status: DC | PRN

## 2011-05-20 MED ORDER — MORPHINE SULFATE 4 MG/ML IJ SOLN
4.0000 mg | INTRAMUSCULAR | Status: DC | PRN
  Administered 2011-05-20 – 2011-05-22 (×7): 4 mg via INTRAVENOUS
  Filled 2011-05-20 (×7): qty 1

## 2011-05-20 MED ORDER — ONDANSETRON HCL 4 MG/2ML IJ SOLN: 4.0000 mg | INTRAMUSCULAR | Status: DC | PRN

## 2011-05-20 MED ORDER — IPRATROPIUM BROMIDE 0.02 % IN SOLN
0.5000 mg | RESPIRATORY_TRACT | Status: DC | PRN
  Administered 2011-05-22: 0.5 mg via RESPIRATORY_TRACT
  Filled 2011-05-20: qty 2.5

## 2011-05-20 MED ORDER — SODIUM CHLORIDE 0.9 % IJ SOLN: 3.0000 mL | INTRAMUSCULAR | Status: DC | PRN

## 2011-05-20 MED ORDER — DOCUSATE SODIUM 100 MG PO CAPS
100.0000 mg | ORAL_CAPSULE | Freq: Two times a day (BID) | ORAL | Status: DC
  Administered 2011-05-20 – 2011-05-25 (×10): 100 mg via ORAL
  Filled 2011-05-20 (×8): qty 1

## 2011-05-20 MED ORDER — LISINOPRIL 10 MG PO TABS
10.0000 mg | ORAL_TABLET | Freq: Every day | ORAL | Status: DC
  Administered 2011-05-20 – 2011-05-25 (×4): 10 mg via ORAL
  Filled 2011-05-20 (×6): qty 1

## 2011-05-20 MED ORDER — AMLODIPINE BESYLATE 10 MG PO TABS
10.0000 mg | ORAL_TABLET | Freq: Every day | ORAL | Status: DC
  Administered 2011-05-22 – 2011-05-25 (×3): 10 mg via ORAL
  Filled 2011-05-20 (×5): qty 1

## 2011-05-20 MED ORDER — ONDANSETRON HCL 4 MG/2ML IJ SOLN: 4.0000 mg | Freq: Once | INTRAMUSCULAR | Status: DC | PRN

## 2011-05-20 MED ORDER — INSULIN NPH (HUMAN) (ISOPHANE) 100 UNIT/ML ~~LOC~~ SUSP
30.0000 [IU] | Freq: Every day | SUBCUTANEOUS | Status: DC
  Administered 2011-05-21 – 2011-05-22 (×2): 30 [IU] via SUBCUTANEOUS

## 2011-05-20 MED ORDER — LISINOPRIL-HYDROCHLOROTHIAZIDE 10-12.5 MG PO TABS: 1.0000 | ORAL_TABLET | Freq: Every day | ORAL | Status: DC

## 2011-05-20 MED ORDER — RISPERIDONE 1 MG PO TABS: 1.0000 mg | ORAL_TABLET | Freq: Two times a day (BID) | ORAL | Status: DC

## 2011-05-20 MED ORDER — DEXTROSE 50 % IV SOLN
INTRAVENOUS | Status: DC | PRN
  Administered 2011-05-20: 25 mL via INTRAVENOUS

## 2011-05-20 MED ORDER — ONDANSETRON HCL 4 MG/2ML IJ SOLN
INTRAMUSCULAR | Status: DC | PRN
  Administered 2011-05-20: 4 mg via INTRAVENOUS

## 2011-05-20 MED ORDER — ACETAMINOPHEN 325 MG PO TABS: 650.0000 mg | ORAL_TABLET | ORAL | Status: DC | PRN

## 2011-05-20 MED ORDER — LACTATED RINGERS IV SOLN
INTRAVENOUS | Status: DC | PRN
  Administered 2011-05-20 (×2): via INTRAVENOUS

## 2011-05-20 MED ORDER — INSULIN ASPART 100 UNIT/ML ~~LOC~~ SOLN
0.0000 [IU] | Freq: Three times a day (TID) | SUBCUTANEOUS | Status: DC
  Administered 2011-05-21: 5 [IU] via SUBCUTANEOUS
  Administered 2011-05-22: 3 [IU] via SUBCUTANEOUS
  Filled 2011-05-20: qty 3

## 2011-05-20 MED ORDER — SODIUM CHLORIDE 0.9 % IV SOLN
INTRAVENOUS | Status: DC
  Administered 2011-05-20: 1000 mL via INTRAVENOUS

## 2011-05-20 MED ORDER — MIDAZOLAM HCL 5 MG/5ML IJ SOLN
INTRAMUSCULAR | Status: DC | PRN
  Administered 2011-05-20: 2 mg via INTRAVENOUS

## 2011-05-20 MED ORDER — OXYCODONE-ACETAMINOPHEN 5-325 MG PO TABS
1.0000 | ORAL_TABLET | ORAL | Status: DC | PRN
  Administered 2011-05-20 – 2011-05-22 (×8): 2 via ORAL
  Filled 2011-05-20 (×8): qty 2

## 2011-05-20 MED ORDER — PROPOFOL 10 MG/ML IV EMUL
INTRAVENOUS | Status: DC | PRN
  Administered 2011-05-20: 150 mg via INTRAVENOUS

## 2011-05-20 MED ORDER — SODIUM CHLORIDE 0.9 % IV SOLN: INTRAVENOUS | Status: DC

## 2011-05-20 MED ORDER — CEFAZOLIN SODIUM 1-5 GM-% IV SOLN
1.0000 g | Freq: Three times a day (TID) | INTRAVENOUS | Status: AC
  Administered 2011-05-20 – 2011-05-21 (×2): 1 g via INTRAVENOUS
  Filled 2011-05-20 (×2): qty 50

## 2011-05-20 MED ORDER — ALBUTEROL SULFATE (5 MG/ML) 0.5% IN NEBU
2.5000 mg | INHALATION_SOLUTION | RESPIRATORY_TRACT | Status: DC | PRN
  Administered 2011-05-22: 2.5 mg via RESPIRATORY_TRACT
  Filled 2011-05-20: qty 0.5

## 2011-05-20 MED ORDER — THROMBIN 5000 UNITS EX KIT
PACK | CUTANEOUS | Status: DC | PRN
  Administered 2011-05-20 (×2): 5000 [IU] via TOPICAL

## 2011-05-20 MED ORDER — MUPIROCIN 2 % EX OINT
TOPICAL_OINTMENT | CUTANEOUS | Status: AC
  Filled 2011-05-20: qty 22

## 2011-05-20 MED ORDER — OXYCODONE-ACETAMINOPHEN 5-325 MG PO TABS: 1.0000 | ORAL_TABLET | ORAL | Status: DC | PRN

## 2011-05-20 SURGICAL SUPPLY — 62 items
APL SKNCLS STERI-STRIP NONHPOA (GAUZE/BANDAGES/DRESSINGS)
BENZOIN TINCTURE PRP APPL 2/3 (GAUZE/BANDAGES/DRESSINGS) IMPLANT
BLADE SURG ROTATE 9660 (MISCELLANEOUS) IMPLANT
CANISTER SUCTION 2500CC (MISCELLANEOUS) ×2 IMPLANT
CLOTH BEACON ORANGE TIMEOUT ST (SAFETY) ×2 IMPLANT
CONT SPEC 4OZ CLIKSEAL STRL BL (MISCELLANEOUS) ×1 IMPLANT
DRAPE LAPAROTOMY 100X72X124 (DRAPES) ×2 IMPLANT
DRAPE MICROSCOPE LEICA (MISCELLANEOUS) ×1 IMPLANT
DRAPE POUCH INSTRU U-SHP 10X18 (DRAPES) ×2 IMPLANT
DRSG PAD ABDOMINAL 8X10 ST (GAUZE/BANDAGES/DRESSINGS) ×1 IMPLANT
DURAPREP 26ML APPLICATOR (WOUND CARE) ×1 IMPLANT
ELECT REM PT RETURN 9FT ADLT (ELECTROSURGICAL) ×2
ELECTRODE REM PT RTRN 9FT ADLT (ELECTROSURGICAL) ×1 IMPLANT
GAUZE SPONGE 4X4 16PLY XRAY LF (GAUZE/BANDAGES/DRESSINGS) ×1 IMPLANT
GLOVE BIO SURGEON STRL SZ 6.5 (GLOVE) IMPLANT
GLOVE BIO SURGEON STRL SZ7 (GLOVE) IMPLANT
GLOVE BIO SURGEON STRL SZ7.5 (GLOVE) IMPLANT
GLOVE BIO SURGEON STRL SZ8 (GLOVE) IMPLANT
GLOVE BIO SURGEON STRL SZ8.5 (GLOVE) IMPLANT
GLOVE BIOGEL M 8.0 STRL (GLOVE) ×2 IMPLANT
GLOVE BIOGEL PI IND STRL 7.5 (GLOVE) IMPLANT
GLOVE BIOGEL PI INDICATOR 7.5 (GLOVE) ×1
GLOVE ECLIPSE 6.5 STRL STRAW (GLOVE) IMPLANT
GLOVE ECLIPSE 7.0 STRL STRAW (GLOVE) IMPLANT
GLOVE ECLIPSE 7.5 STRL STRAW (GLOVE) ×2 IMPLANT
GLOVE ECLIPSE 8.0 STRL XLNG CF (GLOVE) IMPLANT
GLOVE ECLIPSE 8.5 STRL (GLOVE) IMPLANT
GLOVE EXAM NITRILE LRG STRL (GLOVE) IMPLANT
GLOVE EXAM NITRILE MD LF STRL (GLOVE) IMPLANT
GLOVE EXAM NITRILE XL STR (GLOVE) IMPLANT
GLOVE EXAM NITRILE XS STR PU (GLOVE) IMPLANT
GLOVE INDICATOR 6.5 STRL GRN (GLOVE) IMPLANT
GLOVE INDICATOR 7.0 STRL GRN (GLOVE) IMPLANT
GLOVE INDICATOR 7.5 STRL GRN (GLOVE) IMPLANT
GLOVE INDICATOR 8.0 STRL GRN (GLOVE) IMPLANT
GLOVE INDICATOR 8.5 STRL (GLOVE) IMPLANT
GLOVE OPTIFIT SS 8.0 STRL (GLOVE) IMPLANT
GLOVE SURG SS PI 6.5 STRL IVOR (GLOVE) IMPLANT
GOWN BRE IMP SLV AUR LG STRL (GOWN DISPOSABLE) ×2 IMPLANT
GOWN BRE IMP SLV AUR XL STRL (GOWN DISPOSABLE) ×1 IMPLANT
GOWN STRL REIN 2XL LVL4 (GOWN DISPOSABLE) IMPLANT
KIT BASIN OR (CUSTOM PROCEDURE TRAY) ×2 IMPLANT
KIT ROOM TURNOVER OR (KITS) ×2 IMPLANT
NS IRRIG 1000ML POUR BTL (IV SOLUTION) ×2 IMPLANT
PACK LAMINECTOMY NEURO (CUSTOM PROCEDURE TRAY) ×2 IMPLANT
PAD ARMBOARD 7.5X6 YLW CONV (MISCELLANEOUS) ×6 IMPLANT
RUBBERBAND STERILE (MISCELLANEOUS) ×2 IMPLANT
SPONGE GAUZE 4X4 12PLY (GAUZE/BANDAGES/DRESSINGS) ×2 IMPLANT
SPONGE SURGIFOAM ABS GEL SZ50 (HEMOSTASIS) ×2 IMPLANT
STAPLER SKIN PROX WIDE 3.9 (STAPLE) ×1 IMPLANT
STRIP CLOSURE SKIN 1/2X4 (GAUZE/BANDAGES/DRESSINGS) IMPLANT
SUT VIC AB 0 CT1 18XCR BRD8 (SUTURE) ×1 IMPLANT
SUT VIC AB 0 CT1 8-18 (SUTURE) ×4
SUT VIC AB 2-0 CP2 18 (SUTURE) ×2 IMPLANT
SUT VIC AB 3-0 SH 8-18 (SUTURE) ×2 IMPLANT
SWAB CULTURE LIQ STUART DBL (MISCELLANEOUS) ×2 IMPLANT
SYR 20ML ECCENTRIC (SYRINGE) ×2 IMPLANT
TAPE CLOTH SURG 4X10 WHT LF (GAUZE/BANDAGES/DRESSINGS) ×1 IMPLANT
TOWEL OR 17X24 6PK STRL BLUE (TOWEL DISPOSABLE) ×2 IMPLANT
TOWEL OR 17X26 10 PK STRL BLUE (TOWEL DISPOSABLE) ×2 IMPLANT
TUBE ANAEROBIC SPECIMEN COL (MISCELLANEOUS) ×2 IMPLANT
WATER STERILE IRR 1000ML POUR (IV SOLUTION) ×2 IMPLANT

## 2011-05-20 NOTE — Transfer of Care (Signed)
Immediate Anesthesia Transfer of Care Note  Patient: Julia [name]  Procedure(s) Performed: Procedure(s) (LRB): LUMBAR WOUND DEBRIDEMENT (N/A)  Patient Location: PACU  Anesthesia Type: General  Level of Consciousness: awake and patient cooperative  Airway & Oxygen Therapy: Patient Spontanous Breathing and Patient connected to face mask oxygen  Post-op Assessment: Report given to PACU RN, Post -op Vital signs reviewed and stable and Patient moving all extremities  Post vital signs: Reviewed and stable  Complications: No apparent anesthesia complications

## 2011-05-20 NOTE — H&P (Signed)
Julia [name] is an 46 y.o. female.   Chief Complaint: wound draining plus fever HPI:  patient under lumbar discectomy by me in January/13. Came back to the hospital on 05/08/11 because recurrent pain. Taken back to surgery by dr Phoebe Perch because recurrent hnp. Went home but mother called  this week because of wound rainage. i saw her in my office and culture was taken. She is admitted today because of increase of drainage  Past Medical History  Diagnosis Date  . Hypertension   . Diabetes mellitus   . COPD (chronic obstructive pulmonary disease)   . Asthma   . Shortness of breath   . Headache   . Arthritis   . Bipolar affective disorder   . Fibromyalgia   . Cirrhosis of liver   . Anxiety     Past Surgical History  Procedure Date  . Neck surgery   . Cesarean section   . Tonsillectomy   . Back surgery     x2  . Knee arthroscopy     L  . Lumbar laminectomy/decompression microdiscectomy 04/20/2011    Procedure: LUMBAR LAMINECTOMY/DECOMPRESSION MICRODISCECTOMY;  Surgeon: Karn Cassis, MD;  Location: [facility];  Service: Neurosurgery;  Laterality: Right;  Right Lumbar four- five Diskectomy  . Lumbar laminectomy/decompression microdiscectomy 05/08/2011    Procedure: LUMBAR LAMINECTOMY/DECOMPRESSION MICRODISCECTOMY 1 LEVEL;  Surgeon: Clydene Fake, MD;  Location: [facility];  Service: Neurosurgery;  Laterality: N/A;  Lumbar four-five, Redo Lumbar Laminectomy, Repair of CSF Leak    History reviewed. No pertinent family history. Social History:  reports that she has been smoking.  She does not have any smokeless tobacco history on file. She reports that she does not drink alcohol or use illicit drugs.  Allergies:  Allergies  Allergen Reactions  . Codeine Hives  . Gabapentin Rash    Medications Prior to Admission  Medication Dose Route Frequency Provider Last Rate Last Dose  . ceFAZolin (ANCEF) IVPB 2 g/50 mL premix  2 g Intravenous 60 min Pre-Op Karn Cassis, MD      .  mupirocin ointment (BACTROBAN) 2 %   Nasal BID Karn Cassis, MD      . oxyCODONE (Oxy IR/ROXICODONE) immediate release tablet 15 mg  15 mg Oral Once Aubery Lapping, MD   15 mg at 05/20/11 1042   Medications Prior to Admission  Medication Sig Dispense Refill  . amLODipine (NORVASC) 10 MG tablet Take 10 mg by mouth daily.      . insulin NPH-insulin regular (NOVOLIN 70/30) (70-30) 100 UNIT/ML injection Inject 30-50 Units into the skin 2 (two) times daily with a meal. 50 units in the morning; 30 units in the evening      . lisinopril-hydrochlorothiazide (PRINZIDE,ZESTORETIC) 10-12.5 MG per tablet Take 1 tablet by mouth daily.      Marland Kitchen oxyCODONE (ROXICODONE) 15 MG immediate release tablet Take 15 mg by mouth every 4 (four) hours as needed. For pain      . risperiDONE (RISPERDAL) 1 MG tablet Take 1-2 mg by mouth 2 (two) times daily. 1mg  in the morning; 2mg  in the evening      . tiotropium (SPIRIVA) 18 MCG inhalation capsule Place 18 mcg into inhaler and inhale daily.        Results for orders placed during the hospital encounter of 05/20/11 (from the past 48 hour(s))  SURGICAL PCR SCREEN     Status: Normal   Collection Time   05/20/11  9:02 AM  Component Value Range Comment   MRSA, PCR NEGATIVE  NEGATIVE     Staphylococcus aureus NEGATIVE  NEGATIVE    HCG, SERUM, QUALITATIVE     Status: Normal   Collection Time   05/20/11  9:19 AM      Component Value Range Comment   Preg, Serum NEGATIVE  NEGATIVE    COMPREHENSIVE METABOLIC PANEL     Status: Abnormal   Collection Time   05/20/11  9:19 AM      Component Value Range Comment   Sodium 130 (*) 135 - 145 (mEq/L)    Potassium 3.9  3.5 - 5.1 (mEq/L)    Chloride 91 (*) 96 - 112 (mEq/L)    CO2 31  19 - 32 (mEq/L)    Glucose, Bld 92  70 - 99 (mg/dL)    BUN 11  6 - 23 (mg/dL)    Creatinine, Ser 2.13  0.50 - 1.10 (mg/dL)    Calcium 9.5  8.4 - 10.5 (mg/dL)    Total Protein 6.7  6.0 - 8.3 (g/dL)    Albumin 2.6 (*) 3.5 - 5.2 (g/dL)    AST 60 (*)  0 - 37 (U/L)    ALT 31  0 - 35 (U/L)    Alkaline Phosphatase 52  39 - 117 (U/L)    Total Bilirubin 0.6  0.3 - 1.2 (mg/dL)    GFR calc non Af Amer >90  >90 (mL/min)    GFR calc Af Amer >90  >90 (mL/min)   CBC     Status: Abnormal   Collection Time   05/20/11  9:19 AM      Component Value Range Comment   WBC 10.7 (*) 4.0 - 10.5 (K/uL)    RBC 3.78 (*) 3.87 - 5.11 (MIL/uL)    Hemoglobin 12.0  12.0 - 15.0 (g/dL)    HCT 08.6 (*) 57.8 - 46.0 (%)    MCV 92.6  78.0 - 100.0 (fL)    MCH 31.7  26.0 - 34.0 (pg)    MCHC 34.3  30.0 - 36.0 (g/dL)    RDW 46.9  62.9 - 52.8 (%)    Platelets 120 (*) 150 - 400 (K/uL)   GLUCOSE, CAPILLARY     Status: Normal   Collection Time   05/20/11  9:25 AM      Component Value Range Comment   Glucose-Capillary 93  70 - 99 (mg/dL)   GLUCOSE, CAPILLARY     Status: Normal   Collection Time   05/20/11 11:27 AM      Component Value Range Comment   Glucose-Capillary 83  70 - 99 (mg/dL)   GLUCOSE, CAPILLARY     Status: Normal   Collection Time   05/20/11 12:25 PM      Component Value Range Comment   Glucose-Capillary 89  70 - 99 (mg/dL)   GLUCOSE, CAPILLARY     Status: Normal   Collection Time   05/20/11  2:47 PM      Component Value Range Comment   Glucose-Capillary 84  70 - 99 (mg/dL)    No results found.  Review of Systems  Constitutional: Negative.   HENT: Negative.   Eyes: Negative.   Respiratory: Negative.   Cardiovascular: Negative.   Gastrointestinal: Negative.   Genitourinary: Negative.   Musculoskeletal: Positive for back pain.  Skin: Negative.   Neurological: Negative.   Endo/Heme/Allergies: Negative.   Psychiatric/Behavioral: Positive for depression.    Blood pressure 112/76, pulse 93, temperature 99.6 F (37.6 C), temperature source  Oral, resp. rate 20, height 5\' 5"  (1.651 m), weight 98.431 kg (217 lb), last menstrual period 04/19/2011, SpO2 92.00%. Physical Exam hent,nl. Cv,nl. Lungs,bilateral ronchii. Abdomen, unable to feel any masses.  Extremities, grade 1 edema. NEURO minimal weakness of df. Wound, lumbar, purulent drainage.  Assessment/Plan I&d of lumbar wound.Marland Kitchen Spoke with patient and mother about risks and yhe need to clean the wound. Julia [name] M 05/20/2011, 3:06 PM

## 2011-05-20 NOTE — Progress Notes (Signed)
Pt. Arrived to short stay with a lower dressing to back with serous drainage. Pt. Requesting dsg. To be changed. I removed dsg. Staples intact, applied sterile 4x4's and secured with tape.

## 2011-05-20 NOTE — Anesthesia Preprocedure Evaluation (Addendum)
Anesthesia Evaluation  Patient identified by MRN, date of birth, ID band Patient awake    Reviewed: Allergy & Precautions, H&P , NPO status , Patient's Chart, lab work & pertinent test results, reviewed documented beta blocker date and time   Airway Mallampati: II TM Distance: >3 FB Neck ROM: Full    Dental  (+) Poor Dentition   Pulmonary asthma , COPD         Cardiovascular hypertension, Pt. on medications     Neuro/Psych  Headaches, PSYCHIATRIC DISORDERS Bipolar Disorder  Neuromuscular disease    GI/Hepatic   Endo/Other  Diabetes mellitus-, Type 2, Oral Hypoglycemic Agents and Insulin Dependent  Renal/GU      Musculoskeletal  (+) Fibromyalgia -  Abdominal   Peds  Hematology   Anesthesia Other Findings   Reproductive/Obstetrics                          Anesthesia Physical Anesthesia Plan  ASA: III  Anesthesia Plan: General   Post-op Pain Management:    Induction: Intravenous  Airway Management Planned: Oral ETT  Additional Equipment:   Intra-op Plan:   Post-operative Plan: Extubation in OR  Informed Consent: I have reviewed the patients History and Physical, chart, labs and discussed the procedure including the risks, benefits and alternatives for the proposed anesthesia with the patient or authorized representative who has indicated his/her understanding and acceptance.     Plan Discussed with: CRNA and Surgeon  Anesthesia Plan Comments:         Anesthesia Quick Evaluation

## 2011-05-20 NOTE — Anesthesia Procedure Notes (Signed)
Procedure Name: Intubation Date/Time: 05/20/2011 3:19 PM Performed by: Einar Crow Pre-anesthesia Checklist: Patient identified, Emergency Drugs available, Suction available and Patient being monitored Patient Re-evaluated:Patient Re-evaluated prior to inductionOxygen Delivery Method: Circle system utilized Preoxygenation: Pre-oxygenation with 100% oxygen Intubation Type: IV induction Ventilation: Mask ventilation without difficulty Laryngoscope Size: Mac and 3 Grade View: Grade I Tube type: Oral Tube size: 7.5 mm Number of attempts: 1 Airway Equipment and Method: Stylet Placement Confirmation: ETT inserted through vocal cords under direct vision,  positive ETCO2 and breath sounds checked- equal and bilateral Secured at: 22 cm Tube secured with: Tape Dental Injury: Teeth and Oropharynx as per pre-operative assessment

## 2011-05-20 NOTE — Progress Notes (Signed)
Pt. Complaining of pain in back, rates 10 on scale 1-10. Notified Dr. Michelle Piper new orders received. Also informed Dr.Ossey that pt's skin color looked pale. Hgb. 12.0. He stated he will assess pt. In holding.

## 2011-05-20 NOTE — Anesthesia Postprocedure Evaluation (Signed)
Anesthesia Post Note  Patient: Julia [name]  Procedure(s) Performed: Procedure(s) (LRB): LUMBAR WOUND DEBRIDEMENT (N/A)  Anesthesia type: general  Patient location: PACU  Post pain: Pain level controlled  Post assessment: Patient's Cardiovascular Status Stable  Last Vitals:  Filed Vitals:   05/20/11 1645  BP: 137/77  Pulse: 87  Temp:   Resp: 26    Post vital signs: Reviewed and stable  Level of consciousness: sedated  Complications: No apparent anesthesia complications

## 2011-05-20 NOTE — Consult Note (Signed)
Julia [name] MRN: [id number] DOB/AGE: [birth year] 46 y.o. Primary Care Physician:WELLS, CHERYL, FNP, FNP Admit date: 05/20/2011 Reason for consult: Diabetes management HPI:  Julia [name] is a pleasant 46 year old Caucasian female with history of hypertension, diabetes, COPD, ongoing tobacco use, cirrhosis of the liver secondary to history of alcohol abuse, bipolar affective disorder who had a recent lumbar discectomy done on 04/20/2011 and lumbar laminectomy/decompression microdissector me on 05/08/2011. Patient subsequently went home after her surgery on 05/08/2011 however was having continued increased drainage from the wound site with times of as high as 100.9. Patient was seen in followup by his neurosurgeon 4 days prior to admission due to the increased drainage was placed empirically on antibiotics for 4 days. Patient continued to have increased drainage with no significant improvement and tends of 100.2-100.9 and was subsequently admitted to the hospital per neurosurgery for IandD of the lumbar. We were called to consult for diabetes management. Patient denies any chills, no chest pain, no shortness of breath, no abdominal pain, no nausea, no vomiting, no dysuria, no constipation, no diarrhea. Patient does endorse a recent nonproductive cough and lower extremity weakness and inability to ambulate.  Past Medical History  Diagnosis Date  . Hypertension   . Diabetes mellitus   . COPD (chronic obstructive pulmonary disease)   . Asthma   . Shortness of breath   . Headache   . Arthritis   . Bipolar affective disorder   . Fibromyalgia   . Cirrhosis of liver   . Anxiety   . Diabetes mellitus 05/20/2011  . HTN (hypertension) 05/20/2011  . S/P diskectomy 05/20/2011    Past Surgical History  Procedure Date  . Neck surgery   . Cesarean section   . Tonsillectomy   . Back surgery     x2  . Knee arthroscopy     L  . Lumbar laminectomy/decompression microdiscectomy 04/20/2011    Procedure: LUMBAR  LAMINECTOMY/DECOMPRESSION MICRODISCECTOMY;  Surgeon: Karn Cassis, MD;  Location: [facility];  Service: Neurosurgery;  Laterality: Right;  Right Lumbar four- five Diskectomy  . Lumbar laminectomy/decompression microdiscectomy 05/08/2011    Procedure: LUMBAR LAMINECTOMY/DECOMPRESSION MICRODISCECTOMY 1 LEVEL;  Surgeon: Clydene Fake, MD;  Location: [facility];  Service: Neurosurgery;  Laterality: N/A;  Lumbar four-five, Redo Lumbar Laminectomy, Repair of CSF Leak    Prior to Admission medications   Medication Sig Start Date End Date Taking? Authorizing Provider  amLODipine (NORVASC) 10 MG tablet Take 10 mg by mouth daily.   Yes Historical Provider, MD  insulin NPH-insulin regular (NOVOLIN 70/30) (70-30) 100 UNIT/ML injection Inject 30-50 Units into the skin 2 (two) times daily with a meal. 50 units in the morning; 30 units in the evening   Yes Historical Provider, MD  lisinopril-hydrochlorothiazide (PRINZIDE,ZESTORETIC) 10-12.5 MG per tablet Take 1 tablet by mouth daily.   Yes Historical Provider, MD  oxyCODONE (ROXICODONE) 15 MG immediate release tablet Take 15 mg by mouth every 4 (four) hours as needed. For pain   Yes Historical Provider, MD  risperiDONE (RISPERDAL) 1 MG tablet Take 1-2 mg by mouth 2 (two) times daily. 1mg  in the morning; 2mg  in the evening   Yes Historical Provider, MD  tiotropium (SPIRIVA) 18 MCG inhalation capsule Place 18 mcg into inhaler and inhale daily.   Yes Historical Provider, MD    Allergies:  Allergies  Allergen Reactions  . Codeine Hives  . Gabapentin Rash    History reviewed. No pertinent family history.  Social History:  reports that she has  been smoking.  She does not have any smokeless tobacco history on file. She reports that she does not drink alcohol or use illicit drugs.  ROS: All systems reviewed with the patient and was positive as per HPI otherwise all other systems are negative.  PHYSICAL EXAM: Blood pressure 125/69, pulse 88, temperature  99 F (37.2 C), temperature source Oral, resp. rate 16, height 5\' 5"  (1.651 m), weight 98.431 kg (217 lb), last menstrual period 04/19/2011, SpO2 94.00%. General: Well-developed well-nourished laying in bed with no acute cardiopulmonary distress.  HEENT: Normocephalic atraumatic. Pupils equal round and reactive to light and accommodation. Extraocular movements intact. Oropharynx is clear, no lesions, no exudates. No bruits, no goiter. Heart: Regular rate and rhythm, without murmurs, rubs, gallops. Lungs: Clear to auscultation bilaterally in the anterior lung fields. Abdomen: Soft, nontender, nondistended, positive bowel sounds. Extremities: No clubbing cyanosis or edema with positive pedal pulses. Neuro: Patient is alert and oriented x3. He is moving extremities spontaneously and is status post a postop. A full neurological exam was not done.    EKG: None  Recent Results (from the past 240 hour(s))  SURGICAL PCR SCREEN     Status: Normal   Collection Time   05/20/11  9:02 AM      Component Value Range Status Comment   MRSA, PCR NEGATIVE  NEGATIVE  Final    Staphylococcus aureus NEGATIVE  NEGATIVE  Final      Lab results:  Basename 05/20/11 0919  NA 130*  K 3.9  CL 91*  CO2 31  GLUCOSE 92  BUN 11  CREATININE 0.63  CALCIUM 9.5  MG --  PHOS --    Basename 05/20/11 0919  AST 60*  ALT 31  ALKPHOS 52  BILITOT 0.6  PROT 6.7  ALBUMIN 2.6*   No results found for this basename: LIPASE:2,AMYLASE:2 in the last 72 hours  Basename 05/20/11 0919  WBC 10.7*  NEUTROABS --  HGB 12.0  HCT 35.0*  MCV 92.6  PLT 120*   No results found for this basename: CKTOTAL:3,CKMB:3,CKMBINDEX:3,TROPONINI:3 in the last 72 hours No components found with this basename: POCBNP:3 No results found for this basename: DDIMER in the last 72 hours No results found for this basename: HGBA1C:2 in the last 72 hours No results found for this basename: CHOL:2,HDL:2,LDLCALC:2,TRIG:2,CHOLHDL:2,LDLDIRECT:2 in  the last 72 hours No results found for this basename: TSH,T4TOTAL,FREET3,T3FREE,THYROIDAB in the last 72 hours No results found for this basename: VITAMINB12:2,FOLATE:2,FERRITIN:2,TIBC:2,IRON:2,RETICCTPCT:2 in the last 72 hours Imaging results:  Mr Lumbar Spine Wo Contrast  05/08/2011  *RADIOLOGY REPORT*  Clinical Data: L4-L5 discectomy.  Severe low back pain with urinary incontinence.  MRI LUMBAR SPINE WITHOUT CONTRAST  Technique:  Multiplanar and multiecho pulse sequences of the lumbar spine were obtained without intravenous contrast.  Comparison: 04/18/2011 radiographs.  04/20/2011 intraoperative localization.  12/16/2010 lumbar spine preoperative MRI.  Findings: Postoperative changes are present at L4-L5.  Incidental imaging the paraspinal soft tissues demonstrate a fluid density structure in the right upper quadrant which probably represents either distended gallbladder or partial visualization of the gastric antrum.  Vertebral body height is preserved. The alignment is unchanged.  Levels from T11-T12 through L2-L3 are unchanged.  L3-L4:  Disc desiccation with shallow broad-based posterior disc bulging.  The right foraminal stenosis is present which is multifactorial.  There is right facet hypertrophy, right facet effusion and right foraminal protrusion producing moderate to severe right foraminal stenosis with impingement on the exiting right L3 nerve.  Lateral recesses appear patent.  Central  canal is patent.  L4-L5:  There is a large superficial posterior fluid collection with susceptibility artifact around the periphery, likely representing metallic debris from recent operation or hemosiderin. Largest axial measurement is 63 mm AP by 40 mm transverse.  Cranial- caudal extent of this superficial fluid collection is 74 mm.  This extends to the L4-L5 right laminotomy bed.  There is deep extension of this fluid collection which also appears to communicate with the right facet joint.  The deep component on  axial imaging measures 12 mm x 11 mm with debris.  There is compression of the thecal sac which is multifactorial.  Severe central stenosis is present.  Fluid signal is present within the disc which may be postoperative however early postoperative infection cannot be excluded.  Similar differential considerations apply to the right facet joint.  There is a central to right paracentral disc extrusion with caudal extension of disc material. Sequestered fragment is present in the right lateral recess compressing the descending right L5 nerve.  Both lateral recesses show compression of the descending nerve associated with severe central stenosis at L4-L5.  Both neural foramina appear patent.  L5-S1:  Degenerative endplate changes with broad-based posterior disc bulge and endplate osteophytes.  Minimal symmetric bilateral foraminal stenosis is unchanged.  Bilateral facet hypertrophy. Central canal patent.  Mild encroachment of both lateral recesses associated with broad-based disc bulge, osteophytes and facet hypertrophy, greater on the right than left.  IMPRESSION: 1.  Interval postoperative changes at L4-L5.  New fluid within the disc space could potentially be postoperative.  Early infection is not excluded.  Clinical correlation is recommended. 2.  Persistent or recurrent disc extrusion at L4-L5 extending inferiorly into the right lateral recess with sequestered fragment inferior to the disc space.  Bilateral lateral recess stenosis and compression of the descending L5 nerve roots.  Severe L4-L5 central stenosis is multifactorial, associated with disc extrusion, right posterior epidural fluid collection with mass effect on the thecal sac and facet hypertrophy.  Septic right facet arthritis is not excluded.  There is compression of the nerve roots of the cauda equina.  Differential considerations for fluid collections include postoperative seroma, hematoma and dural leak. 3.  Unchanged appearance of L3-L4 and L5-S1  with notable right foraminal stenosis at L3-L4 potentially affecting the right L3 nerve.  Critical (Cauda Equina compression) results were called by telephone on 05/08/2011  at  1758 hours to  Dr. Patria Mane, who verbally acknowledged these results.  Original Report Authenticated By: Andreas Newport, M.D.   Dg Lumbar Spine 1 View  05/08/2011  *RADIOLOGY REPORT*  Clinical Data: Intraoperative radiograph during L4-L5 laminectomy.  LUMBAR SPINE - 1 VIEW  Comparison: MRI of the lumbar spine performed earlier today at 05:01 p.m.  Findings: A single cross-table lateral view of the lumbar spine demonstrates spinal instrumentation extending overlying the L4-L5 intervertebral disc space, with associated spreaders posteriorly at this level.  No significant change is seen in comparison to the recent prior MRI.  IMPRESSION: Spinal instrumentation extends overlying the L4-L5 intervertebral disc space.  Original Report Authenticated By: Tonia Ghent, M.D.   Impression/Plan:  Principal Problem:  *Increased wound drainage Active Problems:  Diabetes mellitus  Cough  COPD (chronic obstructive pulmonary disease)  HTN (hypertension)  Bipolar affective disorder  S/P diskectomy   #1 increased wound drainage Patient did have a low-grade temperature home. Patient is status post I&D and cultures are pending. May consider placing empirically on IV Ancef and vancomycin until cultures return. Will recommend ID consult.  Per primary team.  #2 diabetes mellitus Check a hemoglobin A1c. Will resume patient's home dose of NPH 7030 as patient has been placed on a diet. Sliding scale insulin. Diabetes education.  #3 cough We'll get a chest x-ray. Patient does have a slight leukocytosis likely secondary to problem #1. Patient is currently an apparently on IV Ancef. We'll also place on [employer]. Follow.  #4 COPD Stable. Continue Spiriva. We'll place on nebs as needed. Will also start patient on Advair.  #5  hypertension Continue home regimen of lisinopril, norvasc and HCTZ. Follow  #6 bipolar affective disorder Continue home regimen of risperdal  #7 status post lumbar laminectomy/decompression 04/20/2011/lumbar laminectomy decompression microdiss discectomy 05/08/2011 Per primary team.  #8 tobacco abuse Tobacco cessation. We'll place on a nicotine patch.   Upton Russey 05/20/2011, 8:18 PM

## 2011-05-20 NOTE — Preoperative (Signed)
Beta Blockers   Reason not to administer Beta Blockers:Not Applicable 

## 2011-05-20 NOTE — Progress Notes (Signed)
Patient ID: Julia [name], female   DOB: Mar 04, 1966, 46 y.o.   MRN: [id number] I&D of lumbar wound was done. Cultures to the lab. ID to see patient. Op report [phone]

## 2011-05-21 DIAGNOSIS — T8140XA Infection following a procedure, unspecified, initial encounter: Secondary | ICD-10-CM

## 2011-05-21 DIAGNOSIS — Y838 Other surgical procedures as the cause of abnormal reaction of the patient, or of later complication, without mention of misadventure at the time of the procedure: Secondary | ICD-10-CM

## 2011-05-21 LAB — GLUCOSE, CAPILLARY
Glucose-Capillary: 103 mg/dL — ABNORMAL HIGH (ref 70–99)
Glucose-Capillary: 112 mg/dL — ABNORMAL HIGH (ref 70–99)
Glucose-Capillary: 113 mg/dL — ABNORMAL HIGH (ref 70–99)

## 2011-05-21 LAB — HEMOGLOBIN A1C: Mean Plasma Glucose: 117 mg/dL — ABNORMAL HIGH (ref <117)

## 2011-05-21 MED ORDER — TIOTROPIUM BROMIDE MONOHYDRATE 18 MCG IN CAPS
18.0000 ug | ORAL_CAPSULE | Freq: Every day | RESPIRATORY_TRACT | Status: DC
  Administered 2011-05-21 – 2011-05-25 (×4): 18 ug via RESPIRATORY_TRACT
  Filled 2011-05-21: qty 5

## 2011-05-21 MED ORDER — DEXTROSE 5 % IV SOLN
2.0000 g | INTRAVENOUS | Status: DC
  Administered 2011-05-21 – 2011-05-24 (×4): 2 g via INTRAVENOUS
  Filled 2011-05-21 (×5): qty 2

## 2011-05-21 NOTE — Progress Notes (Signed)
Subjective: Patient reports Patient notes drainage from lumbar dressing. Offers no significant complaints of back pain.  Objective: Vital signs in last 24 hours: Temp:  [98.2 F (36.8 C)-99.8 F (37.7 C)] 99.3 F (37.4 C) (03/02 1021) Pulse Rate:  [67-90] 90  (03/02 1021) Resp:  [16-26] 21  (03/02 1021) BP: (94-137)/(63-77) 120/76 mmHg (03/02 1021) SpO2:  [92 %-99 %] 94 % (03/02 1021) FiO2 (%):  [2 %] 2 % (03/01 1645)  Intake/Output from previous day: 03/01 0701 - 03/02 0700 In: 1140 [I.V.:1140] Out: 100 [Blood:100] Intake/Output this shift:    Reinforced dressing shows signs of bleed through. Area around incision and dressing appears dry and clean. Her functional lower extremities intact.  Lab Results:  [facility] 05/20/11 0919  WBC 10.7*  HGB 12.0  HCT 35.0*  PLT 120*   BMET  Basename 05/20/11 0919  NA 130*  K 3.9  CL 91*  CO2 31  GLUCOSE 92  BUN 11  CREATININE 0.63  CALCIUM 9.5    Studies/Results: Dg Chest Port 1 View  05/20/2011  *RADIOLOGY REPORT*  Clinical Data: Cough.  PORTABLE CHEST - 1 VIEW  Comparison: Chest radiograph performed 04/20/2011  Findings: The lungs are well-aerated and clear.  There is no evidence of focal opacification, pleural effusion or pneumothorax. Pulmonary vascularity is at the upper limits of normal.  The lung bases are incompletely imaged on this study.  The heart is normal in size; the mediastinal contour is within normal limits.  No acute osseous abnormalities are seen.  IMPRESSION: No acute cardiopulmonary process seen.  Original Report Authenticated By: Tonia Ghent, M.D.    Assessment/Plan: Status post debridement lumbar wound, cultures pending. Continue dressing changes on back.  LOS: 1 day  Antibiotics and dressing changes await culture results.   Ramon Brant J 05/21/2011, 12:38 PM

## 2011-05-21 NOTE — Progress Notes (Signed)
Subjective: Patient seen, drowsy this morning after she received morphine and percocet. Her blood glucose has been stable in low 100's. Her Hb a1c is 5.7. Cough has improved, no chest pain or shortness of breath.  Objective: Vital signs in last 24 hours: Temp:  [98.2 F (36.8 C)-99.8 F (37.7 C)] 99.3 F (37.4 C) (03/02 0600) Pulse Rate:  [67-93] 67  (03/02 0600) Resp:  [16-26] 19  (03/02 0600) BP: (94-137)/(63-77) 94/64 mmHg (03/02 0600) SpO2:  [92 %-99 %] 95 % (03/02 0600) FiO2 (%):  [2 %] 2 % (03/01 1645) Weight:  [98.431 kg (217 lb)] 98.431 kg (217 lb) (03/01 0921) Weight change:     Intake/Output from previous day: 03/01 0701 - 03/02 0700 In: 1140 [I.V.:1140] Out: 100 [Blood:100]     Physical Exam: HEENT: Atraumatic, normocephalic Neck: Supple Chest : Clear to auscultation bilaterally, no wheezing, no crackles Heart : S1 S2 Regular, no murmurs Abdomen: Soft, Nontender, no organomegaly Ext : No cyanosis, clubbing, edema   Lab Results: Basic Metabolic Panel:  Basename 05/20/11 0919  NA 130*  K 3.9  CL 91*  CO2 31  GLUCOSE 92  BUN 11  CREATININE 0.63  CALCIUM 9.5  MG --  PHOS --   Liver Function Tests:  [facility] 05/20/11 0919  AST 60*  ALT 31  ALKPHOS 52  BILITOT 0.6  PROT 6.7  ALBUMIN 2.6*     Basename 05/20/11 0919  WBC 10.7*  NEUTROABS --  HGB 12.0  HCT 35.0*  MCV 92.6  PLT 120*  CBG:  Basename 05/21/11 0658 05/20/11 2251 05/20/11 1812 05/20/11 1626 05/20/11 1447 05/20/11 1225  GLUCAP 103* 103* 102* 128* 84 89   Hemoglobin A1C:  Basename 05/20/11 2106  HGBA1C 5.7*   Fasting Lipid Panel: No results found for this basename: CHOL,HDL,LDLCALC,TRIG,CHOLHDL,LDLDIRECT in the last 72 hours Thyroid Function Tests: No results found for this basename: TSH,T4TOTAL,FREET4,T3FREE,THYROIDAB in the last 72 hours Anemia Panel: No results found for this basename: VITAMINB12,FOLATE,FERRITIN,TIBC,IRON,RETICCTPCT in the last 72  hours Coagulation: No results found for this basename: LABPROT:2,INR:2 in the last 72 hours Urine Drug Screen: Drugs of Abuse  No results found for this basename: labopia, cocainscrnur, labbenz, amphetmu, thcu, labbarb    Alcohol Level: No results found for this basename: ETH:2 in the last 72 hours Urinalysis: No results found for this basename: COLORURINE:2,APPERANCEUR:2,LABSPEC:2,PHURINE:2,GLUCOSEU:2,HGBUR:2,BILIRUBINUR:2,KETONESUR:2,PROTEINUR:2,UROBILINOGEN:2,NITRITE:2,LEUKOCYTESUR:2 in the last 72 hours Misc. Labs:  Recent Results (from the past 240 hour(s))  SURGICAL PCR SCREEN     Status: Normal   Collection Time   05/20/11  9:02 AM      Component Value Range Status Comment   MRSA, PCR NEGATIVE  NEGATIVE  Final    Staphylococcus aureus NEGATIVE  NEGATIVE  Final   TISSUE CULTURE     Status: Normal (Preliminary result)   Collection Time   05/20/11  4:21 PM      Component Value Range Status Comment   Specimen Description TISSUE BACK   Final    Special Requests     Final    Value: PATIENT ON FOLLOWING ANCEF LUMBAR WOUND DEBRIDEMENT   Gram Stain PENDING   Incomplete    Culture NO GROWTH   Final    Report Status PENDING   Incomplete   WOUND CULTURE     Status: Normal (Preliminary result)   Collection Time   05/20/11  4:24 PM      Component Value Range Status Comment   Specimen Description WOUND BACK   Final    Special Requests  PT ON ANCEF   Final    Gram Stain     Final    Value: MODERATE WBC PRESENT, PREDOMINANTLY PMN     RARE SQUAMOUS EPITHELIAL CELLS PRESENT     NO ORGANISMS SEEN   Culture NO GROWTH   Final    Report Status PENDING   Incomplete   ANAEROBIC CULTURE     Status: Normal (Preliminary result)   Collection Time   05/20/11  4:24 PM      Component Value Range Status Comment   Specimen Description WOUND BACK   Final    Special Requests PT ON ANCEF   Final    Gram Stain     Final    Value: MODERATE WBC PRESENT, PREDOMINANTLY PMN     RARE SQUAMOUS EPITHELIAL CELLS  PRESENT     NO ORGANISMS SEEN   Culture PENDING   Incomplete    Report Status PENDING   Incomplete     Studies/Results: Dg Chest Port 1 View  05/20/2011  *RADIOLOGY REPORT*  Clinical Data: Cough.  PORTABLE CHEST - 1 VIEW  Comparison: Chest radiograph performed 04/20/2011  Findings: The lungs are well-aerated and clear.  There is no evidence of focal opacification, pleural effusion or pneumothorax. Pulmonary vascularity is at the upper limits of normal.  The lung bases are incompletely imaged on this study.  The heart is normal in size; the mediastinal contour is within normal limits.  No acute osseous abnormalities are seen.  IMPRESSION: No acute cardiopulmonary process seen.  Original Report Authenticated By: Tonia Ghent, M.D.    Medications: Scheduled Meds:   . amLODipine  10 mg Oral Daily  . benzonatate  200 mg Oral TID  .  ceFAZolin (ANCEF) IV  1 g Intravenous Q8H  .  ceFAZolin (ANCEF) IV  2 g Intravenous 60 min Pre-Op  . docusate sodium  100 mg Oral BID  . Fluticasone-Salmeterol  1 puff Inhalation BID  . lisinopril  10 mg Oral Daily   And  . hydrochlorothiazide  12.5 mg Oral Daily  . HYDROmorphone      . insulin aspart  0-15 Units Subcutaneous TID WC  . insulin NPH  30 Units Subcutaneous QAC supper  . insulin NPH  50 Units Subcutaneous QAC breakfast  . mupirocin ointment   Nasal BID  . nicotine  14 mg Transdermal Daily  . oxyCODONE  15 mg Oral Once  . risperiDONE  1 mg Oral Daily  . risperiDONE  2 mg Oral QHS  . sodium chloride  3 mL Intravenous Q12H  . tiotropium  18 mcg Inhalation Daily  . DISCONTD:  ceFAZolin (ANCEF) IV  1 g Intravenous Q8H  . DISCONTD: lisinopril-hydrochlorothiazide  1 tablet Oral Daily  . DISCONTD: risperiDONE  1 mg Oral Daily  . DISCONTD: risperiDONE  1-2 mg Oral BID  . DISCONTD: sodium chloride  3 mL Intravenous Q12H  . DISCONTD: tiotropium  18 mcg Inhalation Daily   Continuous Infusions:   . sodium chloride    . sodium chloride 1,000 mL  (05/20/11 1844)  . DISCONTD: sodium chloride    . DISCONTD: sodium chloride     PRN Meds:.acetaminophen, acetaminophen, acetaminophen, acetaminophen, albuterol, diazepam, ipratropium, menthol-cetylpyridinium, morphine, ondansetron (ZOFRAN) IV, ondansetron (ZOFRAN) IV, oxyCODONE-acetaminophen, phenol, sodium chloride, zolpidem, DISCONTD: 0.9 % irrigation (POUR BTL), DISCONTD: bacitracin, DISCONTD: hemostatic agents, DISCONTD: HYDROmorphone, DISCONTD: menthol-cetylpyridinium, DISCONTD: meperidine, DISCONTD: morphine, DISCONTD: morphine DISCONTD: ondansetron (ZOFRAN) IV, DISCONTD: oxyCODONE-acetaminophen, DISCONTD: phenol, DISCONTD: sodium chloride, DISCONTD: thrombin, DISCONTD: zolpidem  Assessment/Plan:  #1  increased wound drainage  Per primary team   #2 diabetes mellitus  Hb A1C is 5.7, Blood glucose well controlled Continue SSI and. NPH Insulin 50 units in am and 30 units in pm  #3 cough  Improved with Tessalon pearls. CXR normal .  #4 COPD  Stable. Continue Spiriva.  Nebs as needed.  Advair  #5 hypertension  BP low Continue home regimen of lisinopril, norvasc and HCTZ.  Would hold these meds for SBP less than 100  #6 bipolar affective disorder  Continue home regimen of risperdal   #7 status post lumbar laminectomy/de compression 04/20/2011/lumbar laminectomy decompression microdiss discectomy 05/08/2011  Per primary team.   #8 tobacco abuse  Tobacco cessation. We'll place on a nicotine patch.      LOS: 1 day   [facility] Pager: [phone] 05/21/2011, 8:01 AM

## 2011-05-21 NOTE — Progress Notes (Signed)
Occupational Therapy Evaluation Patient Details Name: Julia [name] MRN: [id number] DOB: April 10, 1965 Today's Date: 05/21/2011  Problem List:  Patient Active Problem List  Diagnoses  . Diabetes mellitus  . Cough  . Increased wound drainage  . COPD (chronic obstructive pulmonary disease)  . HTN (hypertension)  . Bipolar affective disorder  . S/P diskectomy    Past Medical History:  Past Medical History  Diagnosis Date  . Hypertension   . Diabetes mellitus   . COPD (chronic obstructive pulmonary disease)   . Asthma   . Shortness of breath   . Headache   . Arthritis   . Bipolar affective disorder   . Fibromyalgia   . Cirrhosis of liver   . Anxiety   . Diabetes mellitus 05/20/2011  . HTN (hypertension) 05/20/2011  . S/P diskectomy 05/20/2011   Past Surgical History:  Past Surgical History  Procedure Date  . Neck surgery   . Cesarean section   . Tonsillectomy   . Back surgery     x2  . Knee arthroscopy     L  . Lumbar laminectomy/decompression microdiscectomy 04/20/2011    Procedure: LUMBAR LAMINECTOMY/DECOMPRESSION MICRODISCECTOMY;  Surgeon: Karn Cassis, MD;  Location: [facility];  Service: Neurosurgery;  Laterality: Right;  Right Lumbar four- five Diskectomy  . Lumbar laminectomy/decompression microdiscectomy 05/08/2011    Procedure: LUMBAR LAMINECTOMY/DECOMPRESSION MICRODISCECTOMY 1 LEVEL;  Surgeon: Clydene Fake, MD;  Location: [facility];  Service: Neurosurgery;  Laterality: N/A;  Lumbar four-five, Redo Lumbar Laminectomy, Repair of CSF Leak    OT Assessment/Plan/Recommendation OT Assessment Clinical Impression Statement: Pt with two recent admissions for lumbar discectomy then revision of discectomy now admitted for s/p I&D of spinal infection thus affecting PLOF.  Will follow acutely to address below problem list in prep for d/c home with mother. OT Recommendation/Assessment: Patient does not need any further OT services OT Problem List: Decreased activity  tolerance;Decreased strength;Decreased knowledge of precautions;Pain OT Therapy Diagnosis : Generalized weakness;Acute pain OT Plan OT Frequency: Min 2X/week OT Treatment/Interventions: Self-care/ADL training;DME and/or AE instruction;Therapeutic activities;Patient/family education OT Recommendation Follow Up Recommendations: No OT follow up Equipment Recommended: None recommended by OT Individuals Consulted Consulted and Agree with Results and Recommendations: Patient OT Goals Acute Rehab OT Goals OT Goal Formulation: With patient Time For Goal Achievement: 7 days ADL Goals Pt Will Perform Lower Body Bathing: with modified independence;Sit to stand from chair;Sit to stand from bed ADL Goal: Lower Body Bathing - Progress: Goal set today Pt Will Perform Lower Body Dressing: with modified independence;Sit to stand from chair;Sit to stand from bed;with adaptive equipment ADL Goal: Lower Body Dressing - Progress: Goal set today Pt Will Transfer to Toilet: with modified independence;Regular height toilet ADL Goal: Toilet Transfer - Progress: Goal set today Miscellaneous OT Goals Miscellaneous OT Goal #1: Pt will verbalized 3 out 3 back precautions as precursor to ADLS OT Goal: Miscellaneous Goal #1 - Progress: Goal set today  OT Evaluation Precautions/Restrictions  Precautions Precautions: Back Precaution Booklet Issued: No (pt has handout) Precaution Comments: Pt able to recall 2/3 back precautions. Required Braces or Orthoses: No Restrictions Weight Bearing Restrictions: No Prior Functioning Home Living Lives With: Other (Comment) (going home with mother) Receives Help From: Family Type of Home: House Home Layout: One level Home Access: Stairs to enter Entrance Stairs-Rails: None Entrance Stairs-Number of Steps: 3 Bathroom Shower/Tub: Tub/shower unit;Walk-in shower (walk in shower at [employer] house) Bathroom Toilet: Standard Bathroom Accessibility: Yes How Accessible:  Accessible via walker Home Adaptive Equipment:  Straight cane;Bedside commode/3-in-1;Walker - rolling;Shower chair with back;Reacher;Long-handled sponge Prior Function Level of Independence: Needs assistance with ADLs;Requires assistive device for independence Dressing: Moderate Driving: No Vocation: Unemployed Comments: States that mother has been assisting with LB ADLs. ADL ADL Eating/Feeding: Performed;Independent Where Assessed - Eating/Feeding: Chair Lower Body Bathing: Simulated;Maximal assistance Lower Body Bathing Details (indicate cue type and reason): Pt unable to cross legs to reach feet due to pain.  Where Assessed - Lower Body Bathing: Sitting, chair Lower Body Dressing: Simulated;Maximal assistance Lower Body Dressing Details (indicate cue type and reason): Pt unable to cross legs to reach feet due to pain. Where Assessed - Lower Body Dressing: Sitting, chair ADL Comments: ADL assessment limited due to lethargic state.  Pt states that she has been performing LB dressing by sitting on bed with legs propped up on bed.  Anticipate that pt will increase I with ADLs but is limited today due to pain medication side effects.    Vision/Perception    Cognition Cognition Arousal/Alertness: Lethargic Overall Cognitive Status: Appears within functional limits for tasks assessed Orientation Level: Oriented X4 Cognition - Other Comments: Slow to process information.  Falling asleep intermittently during session due possibly due to pain meds. Sensation/Coordination Sensation Light Touch: Appears Intact (bil. LEs to light touch. ) Coordination Gross Motor Movements are Fluid and Coordinated: Yes Fine Motor Movements are Fluid and Coordinated: Yes Extremity Assessment RUE Assessment RUE Assessment: Within Functional Limits LUE Assessment LUE Assessment: Within Functional Limits Mobility   Exercises  End of Session OT - End of Session Activity Tolerance: Other (comment)  (lethargic possibly due to pain meds) Patient left: in chair;with call bell in reach General Behavior During Session: Lethargic Cognition: WFL for tasks performed   Cipriano Mile 05/21/2011, 3:31 PM  05/21/2011 Cipriano Mile OTR/L Pager [phone] Office [phone]

## 2011-05-21 NOTE — Consult Note (Signed)
Date of Admission:  05/20/2011  Date of Consult:  05/21/2011  Reason for Consult:postoperative wound infection Referring Physician: Dr. Jeral Fruit   HPI: Julia [name] is an 46 y.o. female. With cirrhosis, DM, who underwent on January 30 of this year  underwent left 4-5 diskectomy because of herniated disk. She came back in on May 08, 2011 with worsening of pain, the left worse  than the right one. She was taken to surgery by Dr. Phoebe Perch on a Sunday  night and a L4-5 diskectomy was done. Cultures were taken at that time which failed to grow any organism. She went home and was having copious drainage from wound. She was seen in Office by Dr. Jeral Fruit who cultured the wound on the  Wound from 26th and placed the pt on cipro. Moderate Enterobacter R to ancef and cefoxitin, S to ceftriaxone, ceftaz, zosyn imipenem, cipro, tmpsmx, tobra, gent. She was taken to or and Dr. Jeral Fruit did I and D of lumbar wound debriding it down to near the bone. In fact some purulent material which was sent for culture but which has failed to grow any organisms so far. Julia [name] the patient remains on cefazolin.    Past Medical History  Diagnosis Date  . Hypertension   . Diabetes mellitus   . COPD (chronic obstructive pulmonary disease)   . Asthma   . Shortness of breath   . Headache   . Arthritis   . Bipolar affective disorder   . Fibromyalgia   . Cirrhosis of liver   . Anxiety   . Diabetes mellitus 05/20/2011  . HTN (hypertension) 05/20/2011  . S/P diskectomy 05/20/2011    Past Surgical History  Procedure Date  . Neck surgery   . Cesarean section   . Tonsillectomy   . Back surgery     x2  . Knee arthroscopy     L  . Lumbar laminectomy/decompression microdiscectomy 04/20/2011    Procedure: LUMBAR LAMINECTOMY/DECOMPRESSION MICRODISCECTOMY;  Surgeon: Karn Cassis, MD;  Location: [facility];  Service: Neurosurgery;  Laterality: Right;  Right Lumbar four- five Diskectomy  . Lumbar laminectomy/decompression  microdiscectomy 05/08/2011    Procedure: LUMBAR LAMINECTOMY/DECOMPRESSION MICRODISCECTOMY 1 LEVEL;  Surgeon: Clydene Fake, MD;  Location: [facility];  Service: Neurosurgery;  Laterality: N/A;  Lumbar four-five, Redo Lumbar Laminectomy, Repair of CSF Leak  ergies:   Allergies  Allergen Reactions  . Codeine Hives  . Gabapentin Rash     Medications: I have reviewed patients current medications as documented in Epic Anti-infectives     Start     Dose/Rate Route Frequency Ordered Stop   05/20/11 2200   ceFAZolin (ANCEF) IVPB 1 g/50 mL premix        1 g 100 mL/hr over 30 Minutes Intravenous Every 8 hours 05/20/11 1803 05/21/11 0609   05/20/11 1815   ceFAZolin (ANCEF) IVPB 1 g/50 mL premix  Status:  Discontinued        1 g 100 mL/hr over 30 Minutes Intravenous Every 8 hours 05/20/11 1803 05/20/11 1839   05/20/11 0000   ceFAZolin (ANCEF) IVPB 2 g/50 mL premix        2 g 100 mL/hr over 30 Minutes Intravenous 60 min pre-op 05/19/11 1412 05/20/11 1522          Social History:  reports that she has been smoking.  She does not have any smokeless tobacco history on file. She reports that she does not drink alcohol or use illicit drugs.  History reviewed.  No pertinent family history.  As in HPI and primary teams notes otherwise 12 point review of systems is negative  Blood pressure 120/76, pulse 90, temperature 99.3 F (37.4 C), temperature source Oral, resp. rate 21, height 5\' 5"  (1.651 m), weight 217 lb (98.431 kg), last menstrual period 04/19/2011, SpO2 94.00%. General: Alert and awake, oriented x3, not in any acute distress.  HEENT: anicteric sclera, pupils reactive to light and accommodation, EOMI, oropharynx clear and without exudate CVS regular rate, normal r,  no murmur rubs or gallops Chest: clear to auscultation bilaterally, no wheezing, rales or rhonchi Abdomen: soft nontender, nondistended, normal bowel sounds, Extremities: no  clubbing or edema noted bilaterally Skin:  wamth around lumbar wound Neuro: nonfocal, strength and sensation intact   Results for orders placed during the hospital encounter of 05/20/11 (from the past 48 hour(s))  SURGICAL PCR SCREEN     Status: Normal   Collection Time   05/20/11  9:02 AM      Component Value Range Comment   MRSA, PCR NEGATIVE  NEGATIVE     Staphylococcus aureus NEGATIVE  NEGATIVE    HCG, SERUM, QUALITATIVE     Status: Normal   Collection Time   05/20/11  9:19 AM      Component Value Range Comment   Preg, Serum NEGATIVE  NEGATIVE    COMPREHENSIVE METABOLIC PANEL     Status: Abnormal   Collection Time   05/20/11  9:19 AM      Component Value Range Comment   Sodium 130 (*) 135 - 145 (mEq/L)    Potassium 3.9  3.5 - 5.1 (mEq/L)    Chloride 91 (*) 96 - 112 (mEq/L)    CO2 31  19 - 32 (mEq/L)    Glucose, Bld 92  70 - 99 (mg/dL)    BUN 11  6 - 23 (mg/dL)    Creatinine, Ser 0.45  0.50 - 1.10 (mg/dL)    Calcium 9.5  8.4 - 10.5 (mg/dL)    Total Protein 6.7  6.0 - 8.3 (g/dL)    Albumin 2.6 (*) 3.5 - 5.2 (g/dL)    AST 60 (*) 0 - 37 (U/L)    ALT 31  0 - 35 (U/L)    Alkaline Phosphatase 52  39 - 117 (U/L)    Total Bilirubin 0.6  0.3 - 1.2 (mg/dL)    GFR calc non Af Amer >90  >90 (mL/min)    GFR calc Af Amer >90  >90 (mL/min)   CBC     Status: Abnormal   Collection Time   05/20/11  9:19 AM      Component Value Range Comment   WBC 10.7 (*) 4.0 - 10.5 (K/uL)    RBC 3.78 (*) 3.87 - 5.11 (MIL/uL)    Hemoglobin 12.0  12.0 - 15.0 (g/dL)    HCT 40.9 (*) 81.1 - 46.0 (%)    MCV 92.6  78.0 - 100.0 (fL)    MCH 31.7  26.0 - 34.0 (pg)    MCHC 34.3  30.0 - 36.0 (g/dL)    RDW 91.4  78.2 - 95.6 (%)    Platelets 120 (*) 150 - 400 (K/uL)   GLUCOSE, CAPILLARY     Status: Normal   Collection Time   05/20/11  9:25 AM      Component Value Range Comment   Glucose-Capillary 93  70 - 99 (mg/dL)   GLUCOSE, CAPILLARY     Status: Normal   Collection Time   05/20/11 11:27 AM  Component Value Range Comment   Glucose-Capillary 83  70 -  99 (mg/dL)   GLUCOSE, CAPILLARY     Status: Normal   Collection Time   05/20/11 12:25 PM      Component Value Range Comment   Glucose-Capillary 89  70 - 99 (mg/dL)   GLUCOSE, CAPILLARY     Status: Normal   Collection Time   05/20/11  2:47 PM      Component Value Range Comment   Glucose-Capillary 84  70 - 99 (mg/dL)   TISSUE CULTURE     Status: Normal (Preliminary result)   Collection Time   05/20/11  4:21 PM      Component Value Range Comment   Specimen Description TISSUE BACK      Special Requests        Value: PATIENT ON FOLLOWING ANCEF LUMBAR WOUND DEBRIDEMENT   Gram Stain        Value: RARE WBC PRESENT, PREDOMINANTLY PMN     NO ORGANISMS SEEN   Culture NO GROWTH      Report Status PENDING     ANAEROBIC CULTURE     Status: Normal (Preliminary result)   Collection Time   05/20/11  4:22 PM      Component Value Range Comment   Specimen Description TISSUE BACK      Special Requests        Value: PATIENT ON FOLLOWING ANCEF LUMBAR WOUND DEBRIDEMENT   Gram Stain        Value: RARE WBC PRESENT, PREDOMINANTLY PMN     NO ORGANISMS SEEN   Culture PENDING      Report Status PENDING     WOUND CULTURE     Status: Normal (Preliminary result)   Collection Time   05/20/11  4:24 PM      Component Value Range Comment   Specimen Description WOUND BACK      Special Requests PT ON ANCEF      Gram Stain        Value: MODERATE WBC PRESENT, PREDOMINANTLY PMN     RARE SQUAMOUS EPITHELIAL CELLS PRESENT     NO ORGANISMS SEEN   Culture NO GROWTH      Report Status PENDING     ANAEROBIC CULTURE     Status: Normal (Preliminary result)   Collection Time   05/20/11  4:24 PM      Component Value Range Comment   Specimen Description WOUND BACK      Special Requests PT ON ANCEF      Gram Stain        Value: MODERATE WBC PRESENT, PREDOMINANTLY PMN     RARE SQUAMOUS EPITHELIAL CELLS PRESENT     NO ORGANISMS SEEN   Culture PENDING      Report Status PENDING     GLUCOSE, CAPILLARY     Status: Abnormal    Collection Time   05/20/11  4:26 PM      Component Value Range Comment   Glucose-Capillary 128 (*) 70 - 99 (mg/dL)   GLUCOSE, CAPILLARY     Status: Abnormal   Collection Time   05/20/11  6:12 PM      Component Value Range Comment   Glucose-Capillary 102 (*) 70 - 99 (mg/dL)    Comment 1 Notify RN     HEMOGLOBIN A1C     Status: Abnormal   Collection Time   05/20/11  9:06 PM      Component Value Range Comment   Hemoglobin A1C 5.7 (*) <  5.7 (%)    Mean Plasma Glucose 117 (*) <117 (mg/dL)   GLUCOSE, CAPILLARY     Status: Abnormal   Collection Time   05/20/11 10:51 PM      Component Value Range Comment   Glucose-Capillary 103 (*) 70 - 99 (mg/dL)    Comment 1 Documented in Chart      Comment 2 Notify RN     GLUCOSE, CAPILLARY     Status: Abnormal   Collection Time   05/21/11  6:58 AM      Component Value Range Comment   Glucose-Capillary 103 (*) 70 - 99 (mg/dL)    Comment 1 Documented in Chart      Comment 2 Notify RN     GLUCOSE, CAPILLARY     Status: Abnormal   Collection Time   05/21/11 11:24 AM      Component Value Range Comment   Glucose-Capillary 112 (*) 70 - 99 (mg/dL)       Component Value Date/Time   SDES WOUND BACK 05/20/2011 1624   SDES WOUND BACK 05/20/2011 1624   SPECREQUEST PT ON ANCEF 05/20/2011 1624   SPECREQUEST PT ON ANCEF 05/20/2011 1624   CULT NO GROWTH 05/20/2011 1624   CULT PENDING 05/20/2011 1624   REPTSTATUS PENDING 05/20/2011 1624   REPTSTATUS PENDING 05/20/2011 1624   Dg Chest Port 1 View  05/20/2011  *RADIOLOGY REPORT*  Clinical Data: Cough.  PORTABLE CHEST - 1 VIEW  Comparison: Chest radiograph performed 04/20/2011  Findings: The lungs are well-aerated and clear.  There is no evidence of focal opacification, pleural effusion or pneumothorax. Pulmonary vascularity is at the upper limits of normal.  The lung bases are incompletely imaged on this study.  The heart is normal in size; the mediastinal contour is within normal limits.  No acute osseous abnormalities are seen.   IMPRESSION: No acute cardiopulmonary process seen.  Original Report Authenticated By: Tonia Ghent, M.D.     Recent Results (from the past 720 hour(s))  URINE CULTURE     Status: Normal   Collection Time   05/08/11  6:30 PM      Component Value Range Status Comment   Specimen Description URINE, CLEAN CATCH   Final    Special Requests NONE   Final    Culture  Setup Time [id number]   Final    Colony Count NO GROWTH   Final    Culture NO GROWTH   Final    Report Status 05/10/2011 FINAL   Final   WOUND CULTURE     Status: Normal   Collection Time   05/08/11  8:30 PM      Component Value Range Status Comment   Specimen Description WOUND   Final    Special Requests NONE LUMBAR   Final    Gram Stain     Final    Value: NO WBC SEEN     NO ORGANISMS SEEN     Gram Stain Report Called to,Read Back By and Verified With: Gram Stain Report Called to,Read Back By and Verified With: [facility] J MD 05/08/11 2350 ACAINJ   Culture NO GROWTH 2 DAYS   Final    Report Status 05/10/2011 FINAL   Final   ANAEROBIC CULTURE     Status: Normal   Collection Time   05/08/11  8:30 PM      Component Value Range Status Comment   Specimen Description WOUND   Final    Special Requests NONE LUMBAR   Final  Gram Stain     Final    Value: NO WBC SEEN     NO ORGANISMS SEEN   Culture NO ANAEROBES ISOLATED   Final    Report Status 05/13/2011 FINAL   Final   GRAM STAIN     Status: Normal   Collection Time   05/08/11  8:30 PM      Component Value Range Status Comment   Specimen Description WOUND   Final    Special Requests NONE LUMBAR   Final    Gram Stain     Final    Value: NO WBC SEEN     NO ORGANISMS SEEN     CALLED HIRSCH,J MD 05/08/11 2350 ACAINJ   Report Status 05/08/2011 FINAL   Final   SURGICAL PCR SCREEN     Status: Normal   Collection Time   05/20/11  9:02 AM      Component Value Range Status Comment   MRSA, PCR NEGATIVE  NEGATIVE  Final    Staphylococcus aureus NEGATIVE  NEGATIVE  Final   TISSUE  CULTURE     Status: Normal (Preliminary result)   Collection Time   05/20/11  4:21 PM      Component Value Range Status Comment   Specimen Description TISSUE BACK   Final    Special Requests     Final    Value: PATIENT ON FOLLOWING ANCEF LUMBAR WOUND DEBRIDEMENT   Gram Stain     Final    Value: RARE WBC PRESENT, PREDOMINANTLY PMN     NO ORGANISMS SEEN   Culture NO GROWTH   Final    Report Status PENDING   Incomplete   ANAEROBIC CULTURE     Status: Normal (Preliminary result)   Collection Time   05/20/11  4:22 PM      Component Value Range Status Comment   Specimen Description TISSUE BACK   Final    Special Requests     Final    Value: PATIENT ON FOLLOWING ANCEF LUMBAR WOUND DEBRIDEMENT   Gram Stain     Final    Value: RARE WBC PRESENT, PREDOMINANTLY PMN     NO ORGANISMS SEEN   Culture PENDING   Incomplete    Report Status PENDING   Incomplete   WOUND CULTURE     Status: Normal (Preliminary result)   Collection Time   05/20/11  4:24 PM      Component Value Range Status Comment   Specimen Description WOUND BACK   Final    Special Requests PT ON ANCEF   Final    Gram Stain     Final    Value: MODERATE WBC PRESENT, PREDOMINANTLY PMN     RARE SQUAMOUS EPITHELIAL CELLS PRESENT     NO ORGANISMS SEEN   Culture NO GROWTH   Final    Report Status PENDING   Incomplete   ANAEROBIC CULTURE     Status: Normal (Preliminary result)   Collection Time   05/20/11  4:24 PM      Component Value Range Status Comment   Specimen Description WOUND BACK   Final    Special Requests PT ON ANCEF   Final    Gram Stain     Final    Value: MODERATE WBC PRESENT, PREDOMINANTLY PMN     RARE SQUAMOUS EPITHELIAL CELLS PRESENT     NO ORGANISMS SEEN   Culture PENDING   Incomplete    Report Status PENDING   Incomplete  Impression/Recommendation 46 year old with postoperative lumbar wound infection status post I&D. Cultures in the draining well and had yielded an Enterobacter. Intraoperative cultures after  ciprofloxacin and cefazolin have failed to yield an organism so far.  --I will change her to intravenous Rocephin and place a PICC line. --I will check a sedimentation rate and C-reactive. Protein. --I would prefer to air on the side of caution and give her a course of 4-6 weeks of parenteral therapy. --Followup intraoperative cultures. --for screening purposes O. Check a hepatitis panel and HIV antibody   Thank you so much for this interesting consult,   Acey Lav 05/21/2011, 1:17 PM   [phone] (pager) [phone] (office)

## 2011-05-21 NOTE — Op Note (Signed)
NAMECHEROKEE, BOCCIO NO.:  [id number]  MEDICAL RECORD NO.:  [id number]  LOCATION:  3019                         FACILITY:  [facility]  PHYSICIAN:  Hilda Lias, M.D.   DATE OF BIRTH:  01/29/1966  DATE OF PROCEDURE:  05/21/2011 DATE OF DISCHARGE:                              OPERATIVE REPORT   ADMISSION DIAGNOSIS:  Lumbar wound infection, status post lumbar diskectomy.  POSTOPERATIVE DIAGNOSIS:  Lumbar wound infection, status post lumbar diskectomy.  PROCEDURE:  I and D of the lumbar wound.  Microscope.  SURGEON:  Hilda Lias, MD  CLINICAL HISTORY:  Mrs. Deschepper is a lady who on January 30 of this year underwent left 4-5 diskectomy because of herniated disk.  The patient has a history of COPD, diabetes mellitus, and obesity.  Prior to that, she had elsewhere to lumbar procedure.  The patient did well, but she came back on May 08, 2011 with worsening of pain, the left worse than the right one.  She was taken to surgery by Dr. Phoebe Perch on a Sunday night and a L4-5 diskectomy was done.  The patient went home, but later she had been running some low-grade fever associated with drainage.  I saw her in my office this week.  I gave some culture.  The mother called yesterday because she was draining more and she was having more fever. Because of that, she was admitted to the hospital for I and D of the lumbar wound.  PROCEDURE:  The patient was taken to the OR and after intubation, she was positioned in a prone manner.  The previous staples were removed. The skin was cleaned with DuraPrep.  Midline incision following the previous one was made.  The patient has some purulent material.  No frank pus.  Two specimens were sent to the lab for aerobic and anaerobic culture.  The incision was carried down to the lumbar spine.  We brought the microscope into the area.  There was not any compromise of the thecal sac of the nerve root.  We entered disk space and I took  some sample to send to the lab.  The area was irrigated with copious amount of saline solution.  Valsalva maneuver was negative.  Then, the wound was closed back with Vicryl and staples.  I will be calling Infectious Disease to see Mrs. Belue.          ______________________________ Hilda Lias, M.D.     EB/MEDQ  D:  05/20/2011  T:  05/21/2011  Job:  [id number]

## 2011-05-21 NOTE — Evaluation (Signed)
Physical Therapy Evaluation Patient Details Name: Julia [name] MRN: [id number] DOB: 24-Jul-1965 Today's Date: 05/21/2011  Problem List:  Patient Active Problem List  Diagnoses  . Diabetes mellitus  . Cough  . Increased wound drainage  . COPD (chronic obstructive pulmonary disease)  . HTN (hypertension)  . Bipolar affective disorder  . S/P diskectomy    Past Medical History:  Past Medical History  Diagnosis Date  . Hypertension   . Diabetes mellitus   . COPD (chronic obstructive pulmonary disease)   . Asthma   . Shortness of breath   . Headache   . Arthritis   . Bipolar affective disorder   . Fibromyalgia   . Cirrhosis of liver   . Anxiety   . Diabetes mellitus 05/20/2011  . HTN (hypertension) 05/20/2011  . S/P diskectomy 05/20/2011   Past Surgical History:  Past Surgical History  Procedure Date  . Neck surgery   . Cesarean section   . Tonsillectomy   . Back surgery     x2  . Knee arthroscopy     L  . Lumbar laminectomy/decompression microdiscectomy 04/20/2011    Procedure: LUMBAR LAMINECTOMY/DECOMPRESSION MICRODISCECTOMY;  Surgeon: Karn Cassis, MD;  Location: [facility];  Service: Neurosurgery;  Laterality: Right;  Right Lumbar four- five Diskectomy  . Lumbar laminectomy/decompression microdiscectomy 05/08/2011    Procedure: LUMBAR LAMINECTOMY/DECOMPRESSION MICRODISCECTOMY 1 LEVEL;  Surgeon: Clydene Fake, MD;  Location: [facility];  Service: Neurosurgery;  Laterality: N/A;  Lumbar four-five, Redo Lumbar Laminectomy, Repair of CSF Leak    PT Assessment/Plan/Recommendation PT Assessment Clinical Impression Statement:  46 y/o female with two recent admissions for lumbar discectomy then revision of discectomy now admitted for s/p I&D of spinal infection. Pt presents to PT with decreased mobility, decreased recall of precautions, and overal deconditioning. Pt will benefit from acute PT services to improve overall mobility and to maximize independence to prepare for  safe d/c home. Pt will benefit from HHPT to maximize functional mobility and decrease risk for falls.  PT Recommendation/Assessment: Patient will need skilled PT in the acute care venue PT Problem List: Decreased activity tolerance;Decreased balance;Decreased mobility;Decreased knowledge of use of DME;Decreased knowledge of precautions;Pain (Decreased balance as pt requires RW at this time) PT Therapy Diagnosis : Acute pain;Generalized weakness;Abnormality of gait;Difficulty walking PT Plan PT Frequency: Min 4X/week PT Treatment/Interventions: DME instruction;Gait training;Stair training;Functional mobility training;Therapeutic exercise;Therapeutic activities;Patient/family education PT Recommendation Follow Up Recommendations: Home health PT;Supervision/Assistance - 24 hour Equipment Recommended: None recommended by PT PT Goals  Acute Rehab PT Goals PT Goal Formulation: With patient Time For Goal Achievement: 7 days Pt will Roll Supine to Right Side: Independently PT Goal: Rolling Supine to Right Side - Progress: Goal set today Pt will go Supine/Side to Sit: Independently PT Goal: Supine/Side to Sit - Progress: Goal set today Pt will go Sit to Stand: with modified independence PT Goal: Sit to Stand - Progress: Goal set today Pt will go Stand to Sit: with modified independence PT Goal: Stand to Sit - Progress: Goal set today Pt will Transfer Bed to Chair/Chair to Bed: with modified independence PT Transfer Goal: Bed to Chair/Chair to Bed - Progress: Goal set today Pt will Ambulate: >150 feet;with supervision;with least restrictive assistive device PT Goal: Ambulate - Progress: Goal set today Pt will Go Up / Down Stairs: 3-5 stairs;with least restrictive assistive device;with supervision PT Goal: Up/Down Stairs - Progress: Goal set today  PT Evaluation Precautions/Restrictions  Precautions Precautions: Back Precaution Booklet Issued: No (pt has  handout) Precaution Comments: Verbally  reviewed back precautions, pt able to recall 2/3 precautions from previous visit. Required Braces or Orthoses: No Prior Functioning  Home Living Lives With: Other (Comment) (going home with mother) Receives Help From: Family Type of Home: House Home Layout: One level Home Access: Stairs to enter Entrance Stairs-Rails: None Entrance Stairs-Number of Steps: 3 Bathroom Shower/Tub: Tub/shower unit;Curtain (At mothers house there is also Tourist information centre manager) Firefighter: Standard (with 3n1) Bathroom Accessibility: Yes How Accessible: Accessible via walker Home Adaptive Equipment: Straight cane;Bedside commode/3-in-1;Walker - rolling;Shower chair with back Prior Function Level of Independence: Needs assistance with ADLs;Requires assistive device for independence Dressing: Moderate Driving: No Vocation: Unemployed Cognition Cognition Arousal/Alertness: Lethargic Overall Cognitive Status: Appears within functional limits for tasks assessed Orientation Level: Oriented X4 Cognition - Other Comments: Slightly slow to process, Baseline? Sensation/Coordination Sensation Light Touch: Appears Intact (bil. LEs to light touch. ) Coordination Gross Motor Movements are Fluid and Coordinated: Yes Fine Motor Movements are Fluid and Coordinated: Yes Extremity Assessment RLE Assessment RLE Assessment: Exceptions to [facility] RLE AROM (degrees) Overall AROM Right Lower Extremity: Within functional limits for tasks assessed RLE Strength RLE Overall Strength: Deficits RLE Overall Strength Comments: Generalized deconditioning, grossly >/= 4-/5 LLE Assessment LLE Assessment: Exceptions to WFL LLE AROM (degrees) Overall AROM Left Lower Extremity: Within functional limits for tasks assessed LLE Strength LLE Overall Strength: Deficits LLE Overall Strength Comments: Generalized deconditioning, grossly >/= 4-/5 Mobility (including Balance) Bed Mobility Bed Mobility: Yes Rolling Right: 5:  Supervision Rolling Right Details (indicate cue type and reason): Verbal cues for log roll, cues to make sure HOB flat prior to rolling.  Right Sidelying to Sit: HOB flat;5: Supervision Right Sidelying to Sit Details (indicate cue type and reason): Supervision for safety, cues for maintaining back precautions.  Sitting - Scoot to Edge of Bed: 6: Modified independent (Device/Increase time) Transfers Transfers: Yes Sit to Stand: With upper extremity assist;From bed;4: Min assist Sit to Stand Details (indicate cue type and reason): Verbal cues for hand placement, precautions as pt tends to twist with standing.  Stand to Sit: With upper extremity assist;4: Min assist;To chair/3-in-1 Stand to Sit Details: Verbal cues for hand placement and control of descent. Assist to lower safely and assist with hand placement.  Ambulation/Gait Ambulation/Gait: Yes Ambulation/Gait Assistance: 5: Supervision Ambulation/Gait Assistance Details (indicate cue type and reason): Verbal cues for upright posture and negotiation of RW. Cues to monitor fatigue.  Ambulation Distance (Feet): 100 Feet Assistive device: Rolling walker Gait Pattern: Step-through pattern;Trunk flexed Stairs: No  Posture/Postural Control Posture/Postural Control: No significant limitations Balance Balance Assessed: No Exercise  General Exercises - Lower Extremity Ankle Circles/Pumps: AROM;Both;10 reps;Seated (encouraged to perform throughout day) End of Session PT - End of Session Equipment Utilized During Treatment: Gait belt Activity Tolerance: Patient tolerated treatment well Patient left: in chair;with call bell in reach Nurse Communication: Mobility status for transfers;Mobility status for ambulation;Other (comment) (pt's lethargy, request for pain medication) General Behavior During Session: [facility] for tasks performed Cognition: [facility] for tasks performed  Sherie Don 05/21/2011, 2:52 PM  Sherie Don) Carleene Mains PT, DPT Acute  Rehabilitation [phone]

## 2011-05-22 LAB — HIV ANTIBODY (ROUTINE TESTING W REFLEX): HIV: NONREACTIVE

## 2011-05-22 MED ORDER — SODIUM CHLORIDE 0.9 % IJ SOLN
10.0000 mL | Freq: Two times a day (BID) | INTRAMUSCULAR | Status: DC
  Administered 2011-05-22: 20 mL

## 2011-05-22 MED ORDER — OXYCODONE HCL 5 MG PO TABS
15.0000 mg | ORAL_TABLET | ORAL | Status: DC | PRN
  Administered 2011-05-22 – 2011-05-23 (×5): 15 mg via ORAL
  Filled 2011-05-22 (×5): qty 3

## 2011-05-22 MED ORDER — OXYCODONE HCL 5 MG PO TABS
10.0000 mg | ORAL_TABLET | ORAL | Status: DC | PRN
  Administered 2011-05-22: 10 mg via ORAL
  Filled 2011-05-22: qty 2

## 2011-05-22 MED ORDER — SODIUM CHLORIDE 0.9 % IJ SOLN: 10.0000 mL | INTRAMUSCULAR | Status: DC | PRN

## 2011-05-22 NOTE — Progress Notes (Signed)
CSW received consult for SNF. PT with recommendation for [facility] and OT with no f/u recs. CSW signing off as no other CSW needs noted in chart. Please re-consult if SNF needed.  Dellie Burns, MSW, [place] [phone] (weekend)

## 2011-05-22 NOTE — Progress Notes (Signed)
Subjective: Patient seen, drowsy this morning after she received morphine and percocet. Her blood glucose has been stable in low 100's. Her Hb a1c is 5.7. Cough has improved, no chest pain or shortness of breath. Patient does not want percocet as she has been told to avoid tylenol due to liver problems. She takes oxy IR at home.  Objective: Vital signs in last 24 hours: Temp:  [97.9 F (36.6 C)-99.3 F (37.4 C)] 97.9 F (36.6 C) (03/03 0657) Pulse Rate:  [79-90] 82  (03/03 0657) Resp:  [20-22] 20  (03/03 0657) BP: (94-123)/(69-87) 123/87 mmHg (03/03 0657) SpO2:  [92 %-97 %] 97 % (03/03 0657) Weight change:     Intake/Output from previous day:       Physical Exam: HEENT: Atraumatic, normocephalic Neck: Supple Chest : Clear to auscultation bilaterally, no wheezing, no crackles Heart : S1 S2 Regular, no murmurs Abdomen: Soft, Nontender, no organomegaly Ext : No cyanosis, clubbing, edema   Lab Results: Basic Metabolic Panel:  Basename 05/20/11 0919  NA 130*  K 3.9  CL 91*  CO2 31  GLUCOSE 92  BUN 11  CREATININE 0.63  CALCIUM 9.5  MG --  PHOS --   Liver Function Tests:  [facility] 05/20/11 0919  AST 60*  ALT 31  ALKPHOS 52  BILITOT 0.6  PROT 6.7  ALBUMIN 2.6*     Basename 05/20/11 0919  WBC 10.7*  NEUTROABS --  HGB 12.0  HCT 35.0*  MCV 92.6  PLT 120*  CBG:  Basename 05/21/11 2156 05/21/11 1617 05/21/11 1124 05/21/11 0658 05/20/11 2251 05/20/11 1812  GLUCAP 113* 214* 112* 103* 103* 102*   Hemoglobin A1C:  Basename 05/20/11 2106  HGBA1C 5.7*   Misc. Labs:  Recent Results (from the past 240 hour(s))  SURGICAL PCR SCREEN     Status: Normal   Collection Time   05/20/11  9:02 AM      Component Value Range Status Comment   MRSA, PCR NEGATIVE  NEGATIVE  Final    Staphylococcus aureus NEGATIVE  NEGATIVE  Final   TISSUE CULTURE     Status: Normal (Preliminary result)   Collection Time   05/20/11  4:21 PM      Component Value Range Status Comment   Specimen Description TISSUE BACK   Final    Special Requests     Final    Value: PATIENT ON FOLLOWING ANCEF LUMBAR WOUND DEBRIDEMENT   Gram Stain     Final    Value: RARE WBC PRESENT, PREDOMINANTLY PMN     NO ORGANISMS SEEN   Culture NO GROWTH 1 DAY   Final    Report Status PENDING   Incomplete   ANAEROBIC CULTURE     Status: Normal (Preliminary result)   Collection Time   05/20/11  4:22 PM      Component Value Range Status Comment   Specimen Description TISSUE BACK   Final    Special Requests     Final    Value: PATIENT ON FOLLOWING ANCEF LUMBAR WOUND DEBRIDEMENT   Gram Stain     Final    Value: RARE WBC PRESENT, PREDOMINANTLY PMN     NO ORGANISMS SEEN   Culture     Final    Value: NO ANAEROBES ISOLATED; CULTURE IN PROGRESS FOR 5 DAYS   Report Status PENDING   Incomplete   WOUND CULTURE     Status: Normal (Preliminary result)   Collection Time   05/20/11  4:24 PM  Component Value Range Status Comment   Specimen Description WOUND BACK   Final    Special Requests PT ON ANCEF   Final    Gram Stain     Final    Value: MODERATE WBC PRESENT, PREDOMINANTLY PMN     RARE SQUAMOUS EPITHELIAL CELLS PRESENT     NO ORGANISMS SEEN   Culture NO GROWTH 1 DAY   Final    Report Status PENDING   Incomplete   ANAEROBIC CULTURE     Status: Normal (Preliminary result)   Collection Time   05/20/11  4:24 PM      Component Value Range Status Comment   Specimen Description WOUND BACK   Final    Special Requests PT ON ANCEF   Final    Gram Stain     Final    Value: MODERATE WBC PRESENT, PREDOMINANTLY PMN     RARE SQUAMOUS EPITHELIAL CELLS PRESENT     NO ORGANISMS SEEN   Culture     Final    Value: NO ANAEROBES ISOLATED; CULTURE IN PROGRESS FOR 5 DAYS   Report Status PENDING   Incomplete     Studies/Results: Dg Chest Port 1 View  05/20/2011  *RADIOLOGY REPORT*  Clinical Data: Cough.  PORTABLE CHEST - 1 VIEW  Comparison: Chest radiograph performed 04/20/2011  Findings: The lungs are well-aerated  and clear.  There is no evidence of focal opacification, pleural effusion or pneumothorax. Pulmonary vascularity is at the upper limits of normal.  The lung bases are incompletely imaged on this study.  The heart is normal in size; the mediastinal contour is within normal limits.  No acute osseous abnormalities are seen.  IMPRESSION: No acute cardiopulmonary process seen.  Original Report Authenticated By: Tonia Ghent, M.D.    Medications: Scheduled Meds:    . amLODipine  10 mg Oral Daily  . benzonatate  200 mg Oral TID  . cefTRIAXone (ROCEPHIN)  IV  2 g Intravenous Q24H  . docusate sodium  100 mg Oral BID  . Fluticasone-Salmeterol  1 puff Inhalation BID  . lisinopril  10 mg Oral Daily   And  . hydrochlorothiazide  12.5 mg Oral Daily  . insulin aspart  0-15 Units Subcutaneous TID WC  . insulin NPH  30 Units Subcutaneous QAC supper  . insulin NPH  50 Units Subcutaneous QAC breakfast  . mupirocin ointment   Nasal BID  . nicotine  14 mg Transdermal Daily  . risperiDONE  1 mg Oral Daily  . risperiDONE  2 mg Oral QHS  . sodium chloride  10-40 mL Intracatheter Q12H  . tiotropium  18 mcg Inhalation Daily  . DISCONTD: sodium chloride  3 mL Intravenous Q12H   Continuous Infusions:    . DISCONTD: sodium chloride    . DISCONTD: sodium chloride 1,000 mL (05/20/11 1844)   PRN Meds:.acetaminophen, acetaminophen, albuterol, diazepam, ipratropium, menthol-cetylpyridinium, ondansetron (ZOFRAN) IV, ondansetron (ZOFRAN) IV, oxyCODONE, phenol, sodium chloride, zolpidem, DISCONTD: acetaminophen, DISCONTD: acetaminophen, DISCONTD: morphine, DISCONTD: oxyCODONE-acetaminophen, DISCONTD: sodium chloride  Assessment/Plan:  #1 increased wound drainage  Per primary team   #2 diabetes mellitus  Hb A1C is 5.7, Blood glucose well controlled Continue SSI and. NPH Insulin 50 units in am and 30 units in pm  #3 cough  Improved with Tessalon pearls. CXR normal .  #4 COPD  Stable. Continue Spiriva.    Nebs as needed.  Advair  #5 hypertension  BP low Continue home regimen of lisinopril, norvasc and HCTZ.  Would hold these meds for  SBP less than 100  #6 bipolar affective disorder  Continue home regimen of risperdal   #7 status post lumbar laminectomy/de compression 04/20/2011/lumbar laminectomy decompression microdiss discectomy 05/08/2011  Will d/c Percocet Start Oxy IR 10 mg Po Q 4 hr prn  #8 tobacco abuse  Tobacco cessation. We'll place on a nicotine patch.      LOS: 2 days   [facility] Pager: [phone] 05/22/2011, 9:57 AM

## 2011-05-22 NOTE — Progress Notes (Signed)
Subjective: Patient reports pain in her legs.  Objective: Vital signs in last 24 hours: Temp:  [97.9 F (36.6 C)-99.3 F (37.4 C)] 97.9 F (36.6 C) (03/03 0657) Pulse Rate:  [79-90] 82  (03/03 0657) Resp:  [20-22] 20  (03/03 0657) BP: (94-123)/(69-87) 123/87 mmHg (03/03 0657) SpO2:  [92 %-98 %] 97 % (03/03 0657)  Intake/Output from previous day:   Intake/Output this shift:   Moving all extremities.  Wound:  Lab Results:  Basename 05/20/11 0919  WBC 10.7*  HGB 12.0  HCT 35.0*  PLT 120*   BMET  Basename 05/20/11 0919  NA 130*  K 3.9  CL 91*  CO2 31  GLUCOSE 92  BUN 11  CREATININE 0.63  CALCIUM 9.5    Studies/Results: Dg Chest Port 1 View  05/20/2011  *RADIOLOGY REPORT*  Clinical Data: Cough.  PORTABLE CHEST - 1 VIEW  Comparison: Chest radiograph performed 04/20/2011  Findings: The lungs are well-aerated and clear.  There is no evidence of focal opacification, pleural effusion or pneumothorax. Pulmonary vascularity is at the upper limits of normal.  The lung bases are incompletely imaged on this study.  The heart is normal in size; the mediastinal contour is within normal limits.  No acute osseous abnormalities are seen.  IMPRESSION: No acute cardiopulmonary process seen.  Original Report Authenticated By: Tonia Ghent, M.D.    Assessment/Plan: Picc in place. Cont abx.  LOS: 2 days     Tamyah Cutbirth L 05/22/2011, 8:53 AM

## 2011-05-23 ENCOUNTER — Inpatient Hospital Stay (HOSPITAL_COMMUNITY): Payer: Medicaid Other

## 2011-05-23 LAB — WOUND CULTURE: Culture: NO GROWTH

## 2011-05-23 LAB — HEPATITIS PANEL, ACUTE
HCV Ab: REACTIVE — AB
Hep B C IgM: NEGATIVE
Hepatitis B Surface Ag: NEGATIVE

## 2011-05-23 LAB — GLUCOSE, CAPILLARY
Glucose-Capillary: 158 mg/dL — ABNORMAL HIGH (ref 70–99)
Glucose-Capillary: 68 mg/dL — ABNORMAL LOW (ref 70–99)
Glucose-Capillary: 75 mg/dL (ref 70–99)

## 2011-05-23 MED ORDER — INSULIN NPH (HUMAN) (ISOPHANE) 100 UNIT/ML ~~LOC~~ SUSP
25.0000 [IU] | Freq: Every day | SUBCUTANEOUS | Status: DC
  Administered 2011-05-23 – 2011-05-24 (×2): 25 [IU] via SUBCUTANEOUS

## 2011-05-23 MED ORDER — LIVING WELL WITH DIABETES BOOK
Freq: Once | Status: AC
  Administered 2011-05-23: 13:00:00
  Filled 2011-05-23: qty 1

## 2011-05-23 MED ORDER — OXYCODONE HCL 5 MG PO TABS
30.0000 mg | ORAL_TABLET | Freq: Four times a day (QID) | ORAL | Status: DC | PRN
  Administered 2011-05-23 – 2011-05-25 (×8): 30 mg via ORAL
  Filled 2011-05-23 (×8): qty 6

## 2011-05-23 NOTE — Progress Notes (Signed)
INFECTIOUS DISEASE PROGRESS NOTE  ID: Julia [name] is a 46 y.o. female with superficial wound infection.    Subjective: Complaint of pain, no fever, no chills.   Abtx:  Anti-infectives     Start     Dose/Rate Route Frequency Ordered Stop   05/21/11 1430   cefTRIAXone (ROCEPHIN) 2 g in dextrose 5 % 50 mL IVPB        2 g 100 mL/hr over 30 Minutes Intravenous Every 24 hours 05/21/11 1329     05/20/11 2200   ceFAZolin (ANCEF) IVPB 1 g/50 mL premix        1 g 100 mL/hr over 30 Minutes Intravenous Every 8 hours 05/20/11 1803 05/21/11 0609   05/20/11 1815   ceFAZolin (ANCEF) IVPB 1 g/50 mL premix  Status:  Discontinued        1 g 100 mL/hr over 30 Minutes Intravenous Every 8 hours 05/20/11 1803 05/20/11 1839   05/20/11 0000   ceFAZolin (ANCEF) IVPB 2 g/50 mL premix        2 g 100 mL/hr over 30 Minutes Intravenous 60 min pre-op 05/19/11 1412 05/20/11 1522          Medications: I have reviewed the patient's current medications.  Objective: Vital signs in last 24 hours: Temp:  [98.6 F (37 C)-99.3 F (37.4 C)] 99.1 F (37.3 C) (03/04 0538) Pulse Rate:  [87-98] 90  (03/04 0538) Resp:  [18-20] 20  (03/04 0538) BP: (93-140)/(68-85) 112/75 mmHg (03/04 0538) SpO2:  [90 %-97 %] 92 % (03/04 0904)   General appearance: alert, cooperative, mild distress and with pain Resp: clear to auscultation bilaterally Cardio: regular rate and rhythm, S1, S2 normal, no murmur, click, rub or gallop  Lab Results No results found for this basename: WBC:2,HGB:2,HCT:2,PLATELETS:2,NA:2,K:2,CL:2,CO2:2,BUN:2,CREATININE:2,GLU:2 in the last 72 hours Liver Panel No results found for this basename: PROT:2,ALBUMIN:2,AST:2,ALT:2,ALKPHOS:2,BILITOT:2,BILIDIR:2,IBILI:2 in the last 72 hours Sedimentation Rate  Basename 05/22/11 0603  ESRSEDRATE 80*   C-Reactive Protein  Basename 05/22/11 0603  CRP 3.74*    Microbiology: Recent Results (from the past 240 hour(s))  SURGICAL PCR SCREEN     Status:  Normal   Collection Time   05/20/11  9:02 AM      Component Value Range Status Comment   MRSA, PCR NEGATIVE  NEGATIVE  Final    Staphylococcus aureus NEGATIVE  NEGATIVE  Final   TISSUE CULTURE     Status: Normal (Preliminary result)   Collection Time   05/20/11  4:21 PM      Component Value Range Status Comment   Specimen Description TISSUE BACK   Final    Special Requests     Final    Value: PATIENT ON FOLLOWING ANCEF LUMBAR WOUND DEBRIDEMENT   Gram Stain     Final    Value: RARE WBC PRESENT, PREDOMINANTLY PMN     NO ORGANISMS SEEN   Culture NO GROWTH 2 DAYS   Final    Report Status PENDING   Incomplete   ANAEROBIC CULTURE     Status: Normal (Preliminary result)   Collection Time   05/20/11  4:22 PM      Component Value Range Status Comment   Specimen Description TISSUE BACK   Final    Special Requests     Final    Value: PATIENT ON FOLLOWING ANCEF LUMBAR WOUND DEBRIDEMENT   Gram Stain     Final    Value: RARE WBC PRESENT, PREDOMINANTLY PMN     NO ORGANISMS SEEN  Culture     Final    Value: NO ANAEROBES ISOLATED; CULTURE IN PROGRESS FOR 5 DAYS   Report Status PENDING   Incomplete   WOUND CULTURE     Status: Normal   Collection Time   05/20/11  4:24 PM      Component Value Range Status Comment   Specimen Description WOUND BACK   Final    Special Requests PT ON ANCEF   Final    Gram Stain     Final    Value: MODERATE WBC PRESENT, PREDOMINANTLY PMN     RARE SQUAMOUS EPITHELIAL CELLS PRESENT     NO ORGANISMS SEEN   Culture NO GROWTH 2 DAYS   Final    Report Status 05/23/2011 FINAL   Final   ANAEROBIC CULTURE     Status: Normal (Preliminary result)   Collection Time   05/20/11  4:24 PM      Component Value Range Status Comment   Specimen Description WOUND BACK   Final    Special Requests PT ON ANCEF   Final    Gram Stain     Final    Value: MODERATE WBC PRESENT, PREDOMINANTLY PMN     RARE SQUAMOUS EPITHELIAL CELLS PRESENT     NO ORGANISMS SEEN   Culture     Final    Value: NO  ANAEROBES ISOLATED; CULTURE IN PROGRESS FOR 5 DAYS   Report Status PENDING   Incomplete     Studies/Results: Dg Chest 2 View  05/23/2011  *RADIOLOGY REPORT*  Clinical Data: Cough.  Fever.  Weakness.  CHEST - 2 VIEW  Comparison: 05/20/2011  Findings: Right arm PICC has its tip in the SVC 1 cm above the right atrium.  The lungs are clear.  Vascularity is normal.  No effusions.  No bony abnormalities.  IMPRESSION: No active cardiopulmonary disease.  Right arm PICC well positioned.  Original Report Authenticated By: Thomasenia Sales, M.D.     Assessment/Plan: 1) wound infection - she is on ceftriaxone, cultures remain negative.  She should continue with 4 weeks of IV therapy.  She has picc in place.  I will continue to follow peripherally for cultures.    Phylliss Strege Infectious Diseases 05/23/2011, 1:24 PM

## 2011-05-23 NOTE — Progress Notes (Signed)
Physical Therapy Treatment Patient Details Name: Julia [name] MRN: [id number] DOB: Jul 28, 1965 Today's Date: 05/23/2011  PT Assessment/Plan  PT - Assessment/Plan Comments on Treatment Session: 46 y/o female with two recent admissions for lumbar discectomy then revision of discectomy now admitted for s/p I&D of spinal infection. Pt making progress with ambulation however fatigues quickly. Pt's bil. LE pain improved with mobility from 9/10 to 6/10. Pt with noted Rt. LE decreased strength (unable to perform full hip flexion or knee extension). Pt reporting this is new since I&D surgery.  PT Plan: Discharge plan remains appropriate PT Frequency: Min 4X/week Follow Up Recommendations: Home health PT;Supervision/Assistance - 24 hour Equipment Recommended: None recommended by PT PT Goals  Acute Rehab PT Goals Pt will Roll Supine to Right Side: Independently PT Goal: Rolling Supine to Right Side - Progress: Progressing toward goal Pt will go Supine/Side to Sit: Independently PT Goal: Supine/Side to Sit - Progress: Progressing toward goal Pt will go Sit to Stand: with modified independence PT Goal: Sit to Stand - Progress: Progressing toward goal Pt will go Stand to Sit: with modified independence PT Goal: Stand to Sit - Progress: Progressing toward goal Pt will Transfer Bed to Chair/Chair to Bed: with modified independence PT Transfer Goal: Bed to Chair/Chair to Bed - Progress: Progressing toward goal Pt will Ambulate: >150 feet;with least restrictive assistive device;with modified independence PT Goal: Ambulate - Progress: Progressing toward goal Pt will Go Up / Down Stairs: 3-5 stairs;with least restrictive assistive device;with supervision PT Goal: Up/Down Stairs - Progress: Not met  PT Treatment Precautions/Restrictions  Precautions Precautions: Back;Fall Precaution Booklet Issued: No (pt has handout) Precaution Comments: Verbally reviewed back precautions prior to session. Pt continues to  need reinforcement with functional mobility.  Required Braces or Orthoses: No Restrictions Weight Bearing Restrictions: No Mobility (including Balance) Bed Mobility Bed Mobility: Yes Rolling Right: 5: Supervision Rolling Right Details (indicate cue type and reason): Verbal cues for log roll, pt initially attempting to sit on EOB with HOB elevated and knees of bed flexed.  Right Sidelying to Sit: HOB flat;5: Supervision Right Sidelying to Sit Details (indicate cue type and reason): Pt attempting to sit up with a pillow under Lt. hip, verbal cues for back precautions. No physical assist required.  Sitting - Scoot to Edge of Bed: 6: Modified independent (Device/Increase time) Transfers Transfers: Yes Sit to Stand: With upper extremity assist;From bed;5: Supervision Sit to Stand Details (indicate cue type and reason): Supervision for safety, no physical assist required. Stand to Sit: With upper extremity assist;4: Min assist;To chair/3-in-1 Stand to Sit Details: Verbal cues for UE placement, pt with decreased control of descent.  Ambulation/Gait Ambulation/Gait: Yes Ambulation/Gait Assistance: 5: Supervision Ambulation/Gait Assistance Details (indicate cue type and reason): Verbal cues for posture, safe distancing of RW. Supervision for safety.  Ambulation Distance (Feet): 150 Feet Assistive device: Rolling walker Gait Pattern: Step-through pattern;Trunk flexed (decreased foot clearance bilaterally. Rt. more impaired ) Stairs: No  Posture/Postural Control Posture/Postural Control: No significant limitations Balance Balance Assessed: Yes Static Sitting Balance Static Sitting - Balance Support: No upper extremity supported;Feet supported Static Sitting - Level of Assistance: 6: Modified independent (Device/Increase time) Static Standing Balance Static Standing - Balance Support: Bilateral upper extremity supported Static Standing - Level of Assistance: 5: Stand by assistance Exercise    General Exercises - Lower Extremity Ankle Circles/Pumps: AROM;Both;10 reps;Seated (encouraged to perform throughout day) Long Arc Quad: AROM;10 reps;Both;Seated (more difficult for Rt. ) Hip Flexion/Marching: AROM;AAROM;Left;Right;Seated;10 reps (AAROM for Rt. ) End  of Session PT - End of Session Equipment Utilized During Treatment: Gait belt Activity Tolerance: Patient tolerated treatment well Patient left: in chair;with call bell in reach;with family/visitor present Nurse Communication: Mobility status for transfers;Mobility status for ambulation (leaking dressing) General Behavior During Session: [facility] for tasks performed Cognition: Impaired Cognitive Impairment: Slow processing  Sherie Don 05/23/2011, 11:44 AM  Sherie Don) Carleene Mains PT, DPT Acute Rehabilitation [phone]

## 2011-05-23 NOTE — Progress Notes (Signed)
Attempted to see pt. X 2.  On first attempt, pt. Reports increased pain, and refused.  On second attempt, pt sleeping soundly.  Will re-attempt.  Jeani Hawking, OTR/L [phone]

## 2011-05-23 NOTE — Progress Notes (Signed)
Inpatient Diabetes Program Recommendations  AACE/ADA: New Consensus Statement on Inpatient Glycemic Control (2009)  Target Ranges:  Prepandial:   less than 140 mg/dL      Peak postprandial:   less than 180 mg/dL (1-2 hours)      Critically ill patients:  140 - 180 mg/dL   Reason for assessment:  Consult to inpatient diabetes coordinator for 'Diabetes education'.    Inpatient Diabetes Program Recommendations Insulin - Basal: agree with new order for NPH  Note:  Bedside RN will initiate basic inpatient DM education.  Orders placed to begin videos and for the 'Living Well with Diabetes' patient education workbook.    Thank you  Piedad Climes RN,BSN,CDE Inpatient Diabetes Coordinator

## 2011-05-23 NOTE — Progress Notes (Signed)
Subjective: Patient seen, drowsy this morning after she received morphine and percocet. Her blood glucose has been stable in low 100's. Her Hb a1c is 5.7. Cough has improved, no chest pain or shortness of breath. Blood glucose was 68 this  Morning, and now has improved to 110. Objective: Vital signs in last 24 hours: Temp:  [98.6 F (37 C)-99.3 F (37.4 C)] 99.1 F (37.3 C) (03/04 0538) Pulse Rate:  [87-98] 90  (03/04 0538) Resp:  [18-20] 20  (03/04 0538) BP: (93-140)/(68-85) 112/75 mmHg (03/04 0538) SpO2:  [90 %-97 %] 92 % (03/04 0904) Weight change:  Last BM Date: 05/23/11  Intake/Output from previous day:       Physical Exam: HEENT: Atraumatic, normocephalic Neck: Supple Chest : Clear to auscultation bilaterally, no wheezing, no crackles Heart : S1 S2 Regular, no murmurs Abdomen: Soft, Nontender, no organomegaly Ext : No cyanosis, clubbing, edema   Lab Results: Basic Metabolic Panel: No results found for this basename: NA:2,K:2,CL:2,CO2:2,GLUCOSE:2,BUN:2,CREATININE:2,CALCIUM:2,MG:2,PHOS:2 in the last 72 hours Liver Function Tests: No results found for this basename: AST:2,ALT:2,ALKPHOS:2,BILITOT:2,PROT:2,ALBUMIN:2 in the last 72 hours  No results found for this basename: WBC:2,NEUTROABS:2,HGB:2,HCT:2,MCV:2,PLT:2 in the last 72 hoursCBG:  Basename 05/23/11 1121 05/23/11 0700 05/23/11 0642 05/22/11 2130 05/22/11 1624 05/22/11 1112  GLUCAP 86 118* 68* 90 106* 116*   Hemoglobin A1C:  Basename 05/20/11 2106  HGBA1C 5.7*   Misc. Labs:  Recent Results (from the past 240 hour(s))  SURGICAL PCR SCREEN     Status: Normal   Collection Time   05/20/11  9:02 AM      Component Value Range Status Comment   MRSA, PCR NEGATIVE  NEGATIVE  Final    Staphylococcus aureus NEGATIVE  NEGATIVE  Final   TISSUE CULTURE     Status: Normal (Preliminary result)   Collection Time   05/20/11  4:21 PM      Component Value Range Status Comment   Specimen Description TISSUE BACK   Final    Special Requests     Final    Value: PATIENT ON FOLLOWING ANCEF LUMBAR WOUND DEBRIDEMENT   Gram Stain     Final    Value: RARE WBC PRESENT, PREDOMINANTLY PMN     NO ORGANISMS SEEN   Culture NO GROWTH 2 DAYS   Final    Report Status PENDING   Incomplete   ANAEROBIC CULTURE     Status: Normal (Preliminary result)   Collection Time   05/20/11  4:22 PM      Component Value Range Status Comment   Specimen Description TISSUE BACK   Final    Special Requests     Final    Value: PATIENT ON FOLLOWING ANCEF LUMBAR WOUND DEBRIDEMENT   Gram Stain     Final    Value: RARE WBC PRESENT, PREDOMINANTLY PMN     NO ORGANISMS SEEN   Culture     Final    Value: NO ANAEROBES ISOLATED; CULTURE IN PROGRESS FOR 5 DAYS   Report Status PENDING   Incomplete   WOUND CULTURE     Status: Normal   Collection Time   05/20/11  4:24 PM      Component Value Range Status Comment   Specimen Description WOUND BACK   Final    Special Requests PT ON ANCEF   Final    Gram Stain     Final    Value: MODERATE WBC PRESENT, PREDOMINANTLY PMN     RARE SQUAMOUS EPITHELIAL CELLS PRESENT     NO  ORGANISMS SEEN   Culture NO GROWTH 2 DAYS   Final    Report Status 05/23/2011 FINAL   Final   ANAEROBIC CULTURE     Status: Normal (Preliminary result)   Collection Time   05/20/11  4:24 PM      Component Value Range Status Comment   Specimen Description WOUND BACK   Final    Special Requests PT ON ANCEF   Final    Gram Stain     Final    Value: MODERATE WBC PRESENT, PREDOMINANTLY PMN     RARE SQUAMOUS EPITHELIAL CELLS PRESENT     NO ORGANISMS SEEN   Culture     Final    Value: NO ANAEROBES ISOLATED; CULTURE IN PROGRESS FOR 5 DAYS   Report Status PENDING   Incomplete     Studies/Results: No results found.  Medications: Scheduled Meds:    . amLODipine  10 mg Oral Daily  . benzonatate  200 mg Oral TID  . cefTRIAXone (ROCEPHIN)  IV  2 g Intravenous Q24H  . docusate sodium  100 mg Oral BID  . Fluticasone-Salmeterol  1 puff  Inhalation BID  . lisinopril  10 mg Oral Daily   And  . hydrochlorothiazide  12.5 mg Oral Daily  . insulin aspart  0-15 Units Subcutaneous TID WC  . insulin NPH  30 Units Subcutaneous QAC supper  . insulin NPH  50 Units Subcutaneous QAC breakfast  . mupirocin ointment   Nasal BID  . nicotine  14 mg Transdermal Daily  . risperiDONE  1 mg Oral Daily  . risperiDONE  2 mg Oral QHS  . sodium chloride  10-40 mL Intracatheter Q12H  . tiotropium  18 mcg Inhalation Daily   Continuous Infusions:   PRN Meds:.acetaminophen, acetaminophen, albuterol, diazepam, ipratropium, menthol-cetylpyridinium, ondansetron (ZOFRAN) IV, ondansetron (ZOFRAN) IV, oxyCODONE, phenol, sodium chloride, zolpidem, DISCONTD: oxyCODONE, DISCONTD: oxyCODONE  Assessment/Plan:  #1 increased wound drainage  Per primary team   #2 diabetes mellitus  Hb A1C is 5.7, Blood glucose well controlled Continue SSI and. NPH Insulin 50 units in am, Will  decrease the NPH to 25 units at bedtime while in hospital   #3 cough  Improved with Tessalon pearls. CXR normal .  #4 COPD  Stable. Continue Spiriva.  Nebs as needed.  Advair  #5 hypertension  BP low Continue home regimen of lisinopril, norvasc and HCTZ.  Would hold these meds for SBP less than 100  #6 bipolar affective disorder  Continue home regimen of risperdal   #7 status post lumbar laminectomy/de compression 04/20/2011/lumbar laminectomy decompression microdiss discectomy 05/08/2011  Will d/c Percocet Start Oxy IR 10 mg Po Q 4 hr prn  #8 tobacco abuse  Tobacco cessation. We'll place on a nicotine patch.   Will sign off, patient can be discharged on her home medication dose when stable   LOS: 3 days   [facility] Pager: [phone] 05/23/2011, 11:29 AM

## 2011-05-23 NOTE — Progress Notes (Signed)
Patient ID: Julia [name], female   DOB: Jan 01, 1966, 46 y.o.   MRN: [id number] Afebril. Seen by ID and Hospitalist. C/o pain in rigfht leg no tenderness. Pt/ot to see.

## 2011-05-23 NOTE — Progress Notes (Signed)
Her wound is still oozing, changed the dressing.

## 2011-05-24 LAB — TISSUE CULTURE: Culture: NO GROWTH

## 2011-05-24 LAB — GLUCOSE, CAPILLARY
Glucose-Capillary: 85 mg/dL (ref 70–99)
Glucose-Capillary: 94 mg/dL (ref 70–99)
Glucose-Capillary: 113 mg/dL — ABNORMAL HIGH (ref 70–99)

## 2011-05-24 NOTE — Progress Notes (Signed)
Patient ID: Julia [name], female   DOB: 06/13/1965, 46 y.o.   MRN: [id number] Decrease OF temperature. Still draining. Dm under better control. Continue as ID

## 2011-05-24 NOTE — Progress Notes (Signed)
INFECTIOUS DISEASE PROGRESS NOTE  ID: Julia [name] is a 46 y.o. female with superficial wound infection with history of lumbar discectomy.  Subjective: She endorses less pain.  No fever or chills  Abtx:  Anti-infectives     Start     Dose/Rate Route Frequency Ordered Stop   05/21/11 1430   cefTRIAXone (ROCEPHIN) 2 g in dextrose 5 % 50 mL IVPB        2 g 100 mL/hr over 30 Minutes Intravenous Every 24 hours 05/21/11 1329     05/20/11 2200   ceFAZolin (ANCEF) IVPB 1 g/50 mL premix        1 g 100 mL/hr over 30 Minutes Intravenous Every 8 hours 05/20/11 1803 05/21/11 0609   05/20/11 1815   ceFAZolin (ANCEF) IVPB 1 g/50 mL premix  Status:  Discontinued        1 g 100 mL/hr over 30 Minutes Intravenous Every 8 hours 05/20/11 1803 05/20/11 1839   05/20/11 0000   ceFAZolin (ANCEF) IVPB 2 g/50 mL premix        2 g 100 mL/hr over 30 Minutes Intravenous 60 min pre-op 05/19/11 1412 05/20/11 1522          Medications: I have reviewed the patient's current medications.  Objective: Vital signs in last 24 hours: Temp:  [98.1 F (36.7 C)-98.9 F (37.2 C)] 98.1 F (36.7 C) (03/05 0940) Pulse Rate:  [76-98] 76  (03/05 0940) Resp:  [17-20] 17  (03/05 0940) BP: (103-127)/(69-82) 108/75 mmHg (03/05 0940) SpO2:  [89 %-95 %] 95 % (03/05 1200)   General appearance: alert, cooperative and no distress Resp: clear to auscultation bilaterally Cardio: regular rate and rhythm, S1, S2 normal, no murmur, click, rub or gallop  Lab Results No results found for this basename: WBC:2,HGB:2,HCT:2,PLATELETS:2,NA:2,K:2,CL:2,CO2:2,BUN:2,CREATININE:2,GLU:2 in the last 72 hours Liver Panel No results found for this basename: PROT:2,ALBUMIN:2,AST:2,ALT:2,ALKPHOS:2,BILITOT:2,BILIDIR:2,IBILI:2 in the last 72 hours Sedimentation Rate  Basename 05/22/11 0603  ESRSEDRATE 80*   C-Reactive Protein  Basename 05/22/11 0603  CRP 3.74*    Microbiology: Recent Results (from the past 240 hour(s))  SURGICAL  PCR SCREEN     Status: Normal   Collection Time   05/20/11  9:02 AM      Component Value Range Status Comment   MRSA, PCR NEGATIVE  NEGATIVE  Final    Staphylococcus aureus NEGATIVE  NEGATIVE  Final   TISSUE CULTURE     Status: Normal   Collection Time   05/20/11  4:21 PM      Component Value Range Status Comment   Specimen Description TISSUE BACK   Final    Special Requests     Final    Value: PATIENT ON FOLLOWING ANCEF LUMBAR WOUND DEBRIDEMENT   Gram Stain     Final    Value: RARE WBC PRESENT, PREDOMINANTLY PMN     NO ORGANISMS SEEN   Culture NO GROWTH 3 DAYS   Final    Report Status 05/24/2011 FINAL   Final   ANAEROBIC CULTURE     Status: Normal (Preliminary result)   Collection Time   05/20/11  4:22 PM      Component Value Range Status Comment   Specimen Description TISSUE BACK   Final    Special Requests     Final    Value: PATIENT ON FOLLOWING ANCEF LUMBAR WOUND DEBRIDEMENT   Gram Stain     Final    Value: RARE WBC PRESENT, PREDOMINANTLY PMN     NO ORGANISMS SEEN  Culture     Final    Value: NO ANAEROBES ISOLATED; CULTURE IN PROGRESS FOR 5 DAYS   Report Status PENDING   Incomplete   WOUND CULTURE     Status: Normal   Collection Time   05/20/11  4:24 PM      Component Value Range Status Comment   Specimen Description WOUND BACK   Final    Special Requests PT ON ANCEF   Final    Gram Stain     Final    Value: MODERATE WBC PRESENT, PREDOMINANTLY PMN     RARE SQUAMOUS EPITHELIAL CELLS PRESENT     NO ORGANISMS SEEN   Culture NO GROWTH 2 DAYS   Final    Report Status 05/23/2011 FINAL   Final   ANAEROBIC CULTURE     Status: Normal (Preliminary result)   Collection Time   05/20/11  4:24 PM      Component Value Range Status Comment   Specimen Description WOUND BACK   Final    Special Requests PT ON ANCEF   Final    Gram Stain     Final    Value: MODERATE WBC PRESENT, PREDOMINANTLY PMN     RARE SQUAMOUS EPITHELIAL CELLS PRESENT     NO ORGANISMS SEEN   Culture     Final     Value: NO ANAEROBES ISOLATED; CULTURE IN PROGRESS FOR 5 DAYS   Report Status PENDING   Incomplete     Studies/Results: Dg Chest 2 View  05/23/2011  *RADIOLOGY REPORT*  Clinical Data: Cough.  Fever.  Weakness.  CHEST - 2 VIEW  Comparison: 05/20/2011  Findings: Right arm PICC has its tip in the SVC 1 cm above the right atrium.  The lungs are clear.  Vascularity is normal.  No effusions.  No bony abnormalities.  IMPRESSION: No active cardiopulmonary disease.  Right arm PICC well positioned.  Original Report Authenticated By: Thomasenia Sales, M.D.     Assessment/Plan: 1) wound infection - cultures remain negative, previous culture Enterobacter R to ancef and cefoxitin, on ceftriaxone, she can continue with that for 4 weeks total through March 29th.  She can follow up with the ID clinic at that time before she finishes therapy.  She should get a weekly CBC, CMP, CRP and ESR.  Thanks  Call with questions.    Jamall Strohmeier Infectious Diseases 05/24/2011, 12:55 PM

## 2011-05-24 NOTE — Progress Notes (Signed)
Physical Therapy Treatment Patient Details Name: Julia [name] MRN: [id number] DOB: [birth year] Today's Date: 05/24/2011  PT Assessment/Plan  PT - Assessment/Plan Comments on Treatment Session: 46 y/o female with two recent admissions for lumbar discectomy then revision of discectomy now admitted for s/p I&D of spinal infection. Pt continues to make gains with mobility, able to ascend stairs today with min assist. Encouraged pt to be in chair for all meals and to ambulate 2-3x daily. PT Plan: Discharge plan remains appropriate PT Frequency: Min 4X/week Follow Up Recommendations: Home health PT;Supervision/Assistance - 24 hour Equipment Recommended: None recommended by PT PT Goals  Acute Rehab PT Goals Pt will go Sit to Stand: with modified independence PT Goal: Sit to Stand - Progress: Progressing toward goal Pt will go Stand to Sit: with modified independence PT Goal: Stand to Sit - Progress: Progressing toward goal Pt will Transfer Bed to Chair/Chair to Bed: with modified independence PT Transfer Goal: Bed to Chair/Chair to Bed - Progress: Progressing toward goal Pt will Ambulate: >150 feet;with least restrictive assistive device;with modified independence PT Goal: Ambulate - Progress: Progressing toward goal Pt will Go Up / Down Stairs: 3-5 stairs;with least restrictive assistive device;with supervision PT Goal: Up/Down Stairs - Progress: Progressing toward goal  PT Treatment Precautions/Restrictions  Precautions Precautions: Back;Fall Precaution Booklet Issued: No (pt has handout) Precaution Comments: Pt able to recall 3/3 back precautions, adhered to precautions during session today. Required Braces or Orthoses: No Restrictions Weight Bearing Restrictions: No Mobility (including Balance) Bed Mobility Bed Mobility: No (seated at start) Transfers Transfers: Yes Sit to Stand: With upper extremity assist;5: Supervision;From chair/3-in-1 Sit to Stand Details (indicate cue type and  reason): Supervision for safety, no physical assist required Stand to Sit: With upper extremity assist;4: Min assist;To chair/3-in-1 Stand to Sit Details: Verbal cues for UE placement, pt with decreased control of descent Ambulation/Gait Ambulation/Gait: Yes Ambulation/Gait Assistance: 5: Supervision Ambulation/Gait Assistance Details (indicate cue type and reason): Verbal cues for posture, cues for improved breathing control as pt tends to hold breath with ambulation Ambulation Distance (Feet): 150 Feet Assistive device: Rolling walker Gait Pattern: Step-through pattern;Trunk flexed (decreased foot clearance bilaterally. Rt. more impaired ) Stairs: Yes Stairs Assistance: 4: Min assist Stairs Assistance Details (indicate cue type and reason): Pt able to perform steps with HHA, verbal cues for safe sequencing.  Stair Management Technique: No rails;Step to pattern;Forwards Number of Stairs: 4     Exercise  General Exercises - Lower Extremity Ankle Circles/Pumps: AROM;Both;10 reps;Seated (encouraged to perform throughout day) Long Arc Quad: AROM;10 reps;Both;Seated (more difficult for Rt. ) End of Session PT - End of Session Equipment Utilized During Treatment: Gait belt Activity Tolerance: Patient limited by fatigue;Patient limited by pain Patient left: in chair;with call bell in reach;with family/visitor present General Behavior During Session: [facility] for tasks performed Cognition: Impaired Cognitive Impairment: Slow processing  Wilhemina Bonito 05/24/2011, 12:20 PM  Sherie Don) Carleene Mains PT, DPT Acute Rehabilitation [phone]

## 2011-05-25 ENCOUNTER — Encounter (HOSPITAL_COMMUNITY): Payer: Self-pay | Admitting: Neurosurgery

## 2011-05-25 LAB — ANAEROBIC CULTURE

## 2011-05-25 LAB — GLUCOSE, CAPILLARY: Glucose-Capillary: 77 mg/dL (ref 70–99)

## 2011-05-25 MED ORDER — RISPERIDONE 1 MG PO TABS: 1.0000 mg | ORAL_TABLET | Freq: Two times a day (BID) | ORAL | Status: DC

## 2011-05-25 MED ORDER — HEPARIN SOD (PORK) LOCK FLUSH 100 UNIT/ML IV SOLN
250.0000 [IU] | INTRAVENOUS | Status: AC | PRN
  Administered 2011-05-25: 15:00:00

## 2011-05-25 MED ORDER — DIAZEPAM 5 MG PO TABS
5.0000 mg | ORAL_TABLET | Freq: Four times a day (QID) | ORAL | Status: AC | PRN
Start: 2011-05-25 — End: 2011-05-25
  Administered 2011-05-25: 5 mg via ORAL
  Filled 2011-05-25: qty 1

## 2011-05-25 MED ORDER — DEXTROSE 5 % IV SOLN
2.0000 g | INTRAVENOUS | Status: AC
  Administered 2011-05-25: 2 g via INTRAVENOUS
  Filled 2011-05-25: qty 2

## 2011-05-25 MED ORDER — OXYCODONE HCL 5 MG PO TABS
15.0000 mg | ORAL_TABLET | Freq: Four times a day (QID) | ORAL | Status: AC | PRN
  Administered 2011-05-25: 15 mg via ORAL
  Filled 2011-05-25: qty 3

## 2011-05-25 MED ORDER — AMLODIPINE BESYLATE 10 MG PO TABS: 10.0000 mg | ORAL_TABLET | Freq: Every day | ORAL | Status: DC

## 2011-05-25 MED ORDER — LISINOPRIL-HYDROCHLOROTHIAZIDE 10-12.5 MG PO TABS: 1.0000 | ORAL_TABLET | Freq: Every day | ORAL | Status: DC

## 2011-05-25 MED ORDER — TIOTROPIUM BROMIDE MONOHYDRATE 18 MCG IN CAPS: 18.0000 ug | ORAL_CAPSULE | Freq: Every day | RESPIRATORY_TRACT | Status: DC

## 2011-05-25 MED ORDER — INSULIN NPH ISOPHANE & REGULAR (70-30) 100 UNIT/ML ~~LOC~~ SUSP: 30.0000 [IU] | Freq: Two times a day (BID) | SUBCUTANEOUS | Status: DC

## 2011-05-25 NOTE — Discharge Summary (Signed)
Physician Discharge Summary  Patient ID: Julia [name] MRN: [id number] DOB/AGE: 48/16/1967 48 y.o.  Admit date: 05/20/2011 Discharge date: 05/25/2011  Admission Diagnoses:LUMBAR WOUND INFECTION. dm  Discharge Diagnoses:SAME  Principal Problem:  *Increased wound drainage Active Problems:  Diabetes mellitus  Cough  COPD (chronic obstructive pulmonary disease)  HTN (hypertension)  Bipolar affective disorder  S/P diskectomy   Discharged Condition:afebril, no pain  Hospital Course: I&D OF LUMBAR WOUND  Consults:id  Significant Diagnostic Studies: cultures  Treatments: iv antibiotics  Discharge Exam: Blood pressure 122/87, pulse 80, temperature 98.7 F (37.1 C), temperature source Oral, resp. rate 17, height 5\' 5"  (1.651 m), weight 98.431 kg (217 lb), last menstrual period 05/09/2011, SpO2 93.00%. Incision/Wound:,decrease of drainageafebril  Disposition: 01-Home or Self Care home  Medication List  As of 05/25/2011 11:15 AM   ASK your doctor about these medications         amLODipine 10 MG tablet   Commonly known as: NORVASC   Take 10 mg by mouth daily.      insulin NPH-insulin regular (70-30) 100 UNIT/ML injection   Commonly known as: NOVOLIN 70/30   Inject 30-50 Units into the skin 2 (two) times daily with a meal. 50 units in the morning; 30 units in the evening      lisinopril-hydrochlorothiazide 10-12.5 MG per tablet   Commonly known as: PRINZIDE,ZESTORETIC   Take 1 tablet by mouth daily.      oxyCODONE 15 MG immediate release tablet   Commonly known as: ROXICODONE   Take 15 mg by mouth every 4 (four) hours as needed. For pain      risperiDONE 1 MG tablet   Commonly known as: RISPERDAL   Take 1-2 mg by mouth 2 (two) times daily. 1mg  in the morning; 2mg  in the evening      tiotropium 18 MCG inhalation capsule   Commonly known as: SPIRIVA   Place 18 mcg into inhaler and inhale daily.             Signed: Karn Cassis 05/25/2011, 11:15 AM

## 2011-05-25 NOTE — Progress Notes (Signed)
Patient ID: Julia [name], female   DOB: June 16, 1965, 46 y.o.   MRN: [id number] Afebril, decrease of drainage. ID OK HER TO GO HOME.

## 2011-05-25 NOTE — Discharge Instructions (Signed)
Home Health RN and PT  to be provided by [facility] [phone]

## 2011-05-25 NOTE — Progress Notes (Signed)
Utilization review completed. Sayan Aldava, RN, BSN. 05/25/11  

## 2011-05-25 NOTE — Progress Notes (Signed)
Patient ID: Julia [name], female   DOB: 06-20-1965, 46 y.o.   MRN: [id number] Patient to be discharge. ID will f/u her up n their office. Will call hospitalist to get instructions about her medical problems and medical control

## 2011-05-25 NOTE — Progress Notes (Signed)
PT Cancellation Note  Treatment cancelled today as PT received discontinue order, spoke with pt who has no further mobility questions about going home. Reports no concerns at this time. Please reorder PT if needed! Thanks!  Kiwanna Spraker (Beverely Pace) Carleene Mains PT, DPT Acute Rehabilitation [phone]

## 2011-05-25 NOTE — Progress Notes (Signed)
OT NOTE: Noted OT discontinuation order. Pt. Presently D/C'ing, please re-order OT if needed thanks!  Cassandria Anger, OTR/L Pager: [phone] 05/25/2011 .

## 2011-05-25 NOTE — Progress Notes (Signed)
Pt d/c in stable condition. Pt received antibiotics for today and is set up with [facility] for administration for the next 4 weeks. PICC line was flushed and capped for patient at d/c by IV therapy. Genevieve Norlander also to coordinate [facility] PT. Instructions reviewed with pt and family on follow up and back precautions.

## 2011-05-25 NOTE — Progress Notes (Signed)
CARE MANAGEMENT NOTE 05/25/2011  Patient:  Julia [name], Julia [name]   Account Number:  [id number]  Date Initiated:  05/25/2011  Documentation initiated by:  Vance Peper  Subjective/Objective Assessment:   46 yr old female adm for post op wound infection     Action/Plan:   Discharge planning. Spoke with patient's mother, choice offered. Patient will need HHRN for IV rocephin for 4 weeks. oing to her moms: [address] [place] [phone] Paire   Anticipated DC Date:  05/25/2011   Anticipated DC Plan:  HOME W HOME HEALTH SERVICES  In-house referral  NA      DC Planning Services  NA      [facility] Choice  HOME HEALTH   Choice offered to / List presented to:  C-6 Parent   DME arranged  NA      DME agency  NA     HH arranged  HH-2 PT  HH-1 RN      [facility] agency  [facility]   Status of service:  Completed, signed off  Discharge Disposition:  HOME W HOME HEALTH SERVICES

## 2011-06-09 ENCOUNTER — Encounter: Payer: Self-pay | Admitting: Licensed Clinical Social Worker

## 2011-06-14 ENCOUNTER — Telehealth: Payer: Self-pay | Admitting: *Deleted

## 2011-06-14 ENCOUNTER — Encounter (HOSPITAL_COMMUNITY): Payer: Self-pay | Admitting: Emergency Medicine

## 2011-06-14 ENCOUNTER — Inpatient Hospital Stay (HOSPITAL_COMMUNITY)
Admission: EM | Admit: 2011-06-14 | Discharge: 2011-06-27 | DRG: 490 | Disposition: A | Discharge status: Skilled Nursing Facility | Payer: Medicaid Other | Attending: Neurosurgery | Admitting: Neurosurgery

## 2011-06-14 ENCOUNTER — Emergency Department (HOSPITAL_COMMUNITY): Payer: Medicaid Other

## 2011-06-14 DIAGNOSIS — M549 Dorsalgia, unspecified: Secondary | ICD-10-CM

## 2011-06-14 DIAGNOSIS — T819XXA Unspecified complication of procedure, initial encounter: Secondary | ICD-10-CM

## 2011-06-14 DIAGNOSIS — E119 Type 2 diabetes mellitus without complications: Secondary | ICD-10-CM | POA: Diagnosis present

## 2011-06-14 DIAGNOSIS — I10 Essential (primary) hypertension: Secondary | ICD-10-CM | POA: Diagnosis present

## 2011-06-14 DIAGNOSIS — J4489 Other specified chronic obstructive pulmonary disease: Secondary | ICD-10-CM | POA: Diagnosis present

## 2011-06-14 DIAGNOSIS — E1169 Type 2 diabetes mellitus with other specified complication: Secondary | ICD-10-CM | POA: Diagnosis present

## 2011-06-14 DIAGNOSIS — M9080 Osteopathy in diseases classified elsewhere, unspecified site: Secondary | ICD-10-CM | POA: Diagnosis present

## 2011-06-14 DIAGNOSIS — F319 Bipolar disorder, unspecified: Secondary | ICD-10-CM | POA: Diagnosis present

## 2011-06-14 DIAGNOSIS — M869 Osteomyelitis, unspecified: Secondary | ICD-10-CM | POA: Diagnosis present

## 2011-06-14 DIAGNOSIS — J449 Chronic obstructive pulmonary disease, unspecified: Secondary | ICD-10-CM | POA: Diagnosis present

## 2011-06-14 DIAGNOSIS — M519 Unspecified thoracic, thoracolumbar and lumbosacral intervertebral disc disorder: Principal | ICD-10-CM | POA: Diagnosis present

## 2011-06-14 DIAGNOSIS — IMO0001 Reserved for inherently not codable concepts without codable children: Secondary | ICD-10-CM | POA: Diagnosis present

## 2011-06-14 DIAGNOSIS — F17200 Nicotine dependence, unspecified, uncomplicated: Secondary | ICD-10-CM | POA: Diagnosis present

## 2011-06-14 DIAGNOSIS — IMO0002 Reserved for concepts with insufficient information to code with codable children: Secondary | ICD-10-CM | POA: Diagnosis present

## 2011-06-14 DIAGNOSIS — K7460 Unspecified cirrhosis of liver: Secondary | ICD-10-CM | POA: Diagnosis present

## 2011-06-14 DIAGNOSIS — F411 Generalized anxiety disorder: Secondary | ICD-10-CM | POA: Diagnosis present

## 2011-06-14 HISTORY — DX: Bipolar disorder, unspecified: F31.9

## 2011-06-14 LAB — BASIC METABOLIC PANEL
BUN: 5 mg/dL — ABNORMAL LOW (ref 6–23)
CO2: 28 mEq/L (ref 19–32)
Chloride: 98 mEq/L (ref 96–112)
Creatinine, Ser: 0.36 mg/dL — ABNORMAL LOW (ref 0.50–1.10)
GFR calc Af Amer: >90 mL/min (ref >90)
Glucose, Bld: 137 mg/dL — ABNORMAL HIGH (ref 70–99)
Potassium: 2.9 mEq/L — ABNORMAL LOW (ref 3.5–5.1)

## 2011-06-14 LAB — DIFFERENTIAL
Basophils Absolute: 0 10*3/uL (ref 0.0–0.1)
Eosinophils Relative: 1 % (ref 0–5)
Lymphocytes Relative: 34 % (ref 12–46)
Monocytes Absolute: 0.9 10*3/uL (ref 0.1–1.0)
Monocytes Relative: 11 % (ref 3–12)

## 2011-06-14 LAB — URINALYSIS, ROUTINE W REFLEX MICROSCOPIC
Bilirubin Urine: NEGATIVE
Glucose, UA: NEGATIVE mg/dL
Hgb urine dipstick: NEGATIVE
Ketones, ur: NEGATIVE mg/dL
Nitrite: NEGATIVE
Specific Gravity, Urine: 1.011 (ref 1.005–1.030)
pH: 6.5 (ref 5.0–8.0)

## 2011-06-14 LAB — CBC
HCT: 36.3 % (ref 36.0–46.0)
Hemoglobin: 12.3 g/dL (ref 12.0–15.0)
MCV: 89.2 fL (ref 78.0–100.0)
RDW: 13.5 % (ref 11.5–15.5)
WBC: 8.4 10*3/uL (ref 4.0–10.5)

## 2011-06-14 LAB — C-REACTIVE PROTEIN: CRP: 2.71 mg/dL — ABNORMAL HIGH (ref <0.60)

## 2011-06-14 MED ORDER — MORPHINE SULFATE 4 MG/ML IJ SOLN
4.0000 mg | INTRAMUSCULAR | Status: DC | PRN
  Administered 2011-06-14 – 2011-06-15 (×5): 4 mg via INTRAVENOUS
  Filled 2011-06-14 (×5): qty 1

## 2011-06-14 MED ORDER — ZOLPIDEM TARTRATE 5 MG PO TABS
10.0000 mg | ORAL_TABLET | Freq: Every evening | ORAL | Status: DC | PRN
  Administered 2011-06-14: 10 mg via ORAL
  Filled 2011-06-14: qty 1

## 2011-06-14 MED ORDER — MORPHINE SULFATE 10 MG/5ML PO SOLN: 5.0000 mg | ORAL | Status: DC | PRN

## 2011-06-14 MED ORDER — MENTHOL 3 MG MT LOZG
1.0000 | LOZENGE | OROMUCOSAL | Status: DC | PRN
  Filled 2011-06-14 (×2): qty 9

## 2011-06-14 MED ORDER — GADOBENATE DIMEGLUMINE 529 MG/ML IV SOLN: 15.0000 mL | Freq: Once | INTRAVENOUS | Status: DC

## 2011-06-14 MED ORDER — PHENOL 1.4 % MT LIQD
1.0000 | OROMUCOSAL | Status: DC | PRN
  Filled 2011-06-14: qty 177

## 2011-06-14 MED ORDER — MORPHINE SULFATE 2 MG/ML IJ SOLN
2.0000 mg | INTRAMUSCULAR | Status: DC | PRN
  Administered 2011-06-14: 2 mg via INTRAVENOUS
  Filled 2011-06-14: qty 1

## 2011-06-14 MED ORDER — TIOTROPIUM BROMIDE MONOHYDRATE 18 MCG IN CAPS
18.0000 ug | ORAL_CAPSULE | Freq: Every day | RESPIRATORY_TRACT | Status: DC
  Administered 2011-06-14 – 2011-06-27 (×12): 18 ug via RESPIRATORY_TRACT
  Filled 2011-06-14 (×3): qty 5

## 2011-06-14 MED ORDER — RISPERIDONE 1 MG PO TABS
1.0000 mg | ORAL_TABLET | Freq: Every day | ORAL | Status: DC
  Administered 2011-06-14 – 2011-06-27 (×14): 1 mg via ORAL
  Filled 2011-06-14 (×14): qty 1

## 2011-06-14 MED ORDER — RISPERIDONE 2 MG PO TABS
2.0000 mg | ORAL_TABLET | Freq: Every day | ORAL | Status: DC
  Administered 2011-06-14 – 2011-06-26 (×13): 2 mg via ORAL
  Filled 2011-06-14 (×15): qty 1

## 2011-06-14 MED ORDER — FENTANYL CITRATE 0.05 MG/ML IJ SOLN
100.0000 ug | Freq: Once | INTRAMUSCULAR | Status: AC
  Administered 2011-06-14: 100 ug via INTRAVENOUS
  Filled 2011-06-14: qty 2

## 2011-06-14 MED ORDER — RISPERIDONE 1 MG PO TABS
1.0000 mg | ORAL_TABLET | Freq: Two times a day (BID) | ORAL | Status: DC
  Filled 2011-06-14 (×2): qty 2

## 2011-06-14 MED ORDER — VANCOMYCIN HCL IN DEXTROSE 1-5 GM/200ML-% IV SOLN
1000.0000 mg | Freq: Three times a day (TID) | INTRAVENOUS | Status: DC
  Administered 2011-06-14 – 2011-06-17 (×8): 1000 mg via INTRAVENOUS
  Filled 2011-06-14 (×11): qty 200

## 2011-06-14 MED ORDER — OXYCODONE HCL 5 MG PO TABS: 15.0000 mg | ORAL_TABLET | ORAL | Status: DC | PRN

## 2011-06-14 MED ORDER — SODIUM CHLORIDE 0.9 % IV SOLN
INTRAVENOUS | Status: DC
  Administered 2011-06-14 – 2011-06-15 (×2): via INTRAVENOUS

## 2011-06-14 MED ORDER — AMLODIPINE BESYLATE 10 MG PO TABS
10.0000 mg | ORAL_TABLET | Freq: Every day | ORAL | Status: DC
  Administered 2011-06-14 – 2011-06-15 (×2): 10 mg via ORAL
  Filled 2011-06-14 (×2): qty 1

## 2011-06-14 MED ORDER — POTASSIUM CHLORIDE 10 MEQ/100ML IV SOLN
10.0000 meq | Freq: Once | INTRAVENOUS | Status: AC
  Administered 2011-06-14: 10 meq via INTRAVENOUS
  Filled 2011-06-14: qty 100

## 2011-06-14 MED ORDER — OXYCODONE HCL 5 MG PO TABS
15.0000 mg | ORAL_TABLET | ORAL | Status: DC | PRN
  Administered 2011-06-14 – 2011-06-27 (×64): 15 mg via ORAL
  Filled 2011-06-14 (×11): qty 3
  Filled 2011-06-14: qty 1
  Filled 2011-06-14 (×5): qty 3
  Filled 2011-06-14: qty 2
  Filled 2011-06-14: qty 3
  Filled 2011-06-14: qty 1
  Filled 2011-06-14 (×12): qty 3
  Filled 2011-06-14: qty 2
  Filled 2011-06-14 (×30): qty 3
  Filled 2011-06-14: qty 1
  Filled 2011-06-14 (×3): qty 3

## 2011-06-14 MED ORDER — GADOBENATE DIMEGLUMINE 529 MG/ML IV SOLN
15.0000 mL | Freq: Once | INTRAVENOUS | Status: AC
  Administered 2011-06-14: 15 mL via INTRAVENOUS

## 2011-06-14 NOTE — H&P (Signed)
Julia [name] is an 46 y.o. female.   Chief Complaint: weakness all over HPI: patient who underwent lumbar discectomy by me in January/12 followed by a recurrent discectomy on February 17 by dr Phoebe Perch. Readmitted because lumbar wound infection she was explored and started on iv antibiotIcs by ID. I  Saw her in my office last week and she was doing better but on February 21 after home pt , she developed more back pain and being unable to walk. No fever. She was advised to come to the er by the nurse taken care of her.  Past Medical History  Diagnosis Date  . Hypertension   . Diabetes mellitus   . COPD (chronic obstructive pulmonary disease)   . Asthma   . Shortness of breath   . Headache   . Arthritis   . Bipolar affective disorder   . Fibromyalgia   . Cirrhosis of liver   . Anxiety   . Diabetes mellitus 05/20/2011  . HTN (hypertension) 05/20/2011  . S/P diskectomy 05/20/2011  . Cirrhosis of liver   . Bipolar affective disorder     Past Surgical History  Procedure Date  . Neck surgery   . Cesarean section   . Tonsillectomy   . Back surgery     x2  . Knee arthroscopy     L  . Lumbar laminectomy/decompression microdiscectomy 04/20/2011    Procedure: LUMBAR LAMINECTOMY/DECOMPRESSION MICRODISCECTOMY;  Surgeon: Karn Cassis, MD;  Location: [facility];  Service: Neurosurgery;  Laterality: Right;  Right Lumbar four- five Diskectomy  . Lumbar laminectomy/decompression microdiscectomy 05/08/2011    Procedure: LUMBAR LAMINECTOMY/DECOMPRESSION MICRODISCECTOMY 1 LEVEL;  Surgeon: Clydene Fake, MD;  Location: [facility];  Service: Neurosurgery;  Laterality: N/A;  Lumbar four-five, Redo Lumbar Laminectomy, Repair of CSF Leak  . Lumbar wound debridement 05/20/2011    Procedure: LUMBAR WOUND DEBRIDEMENT;  Surgeon: Karn Cassis, MD;  Location: [facility];  Service: Neurosurgery;  Laterality: N/A;  Incision and Drainage of Lumbar wound    No family history on file. Social History:   reports that she has been smoking.  She does not have any smokeless tobacco history on file. She reports that she does not drink alcohol or use illicit drugs.  Allergies:  Allergies  Allergen Reactions  . Codeine Hives  . Gabapentin Rash    Medications Prior to Admission  Medication Dose Route Frequency Provider Last Rate Last Dose  . fentaNYL (SUBLIMAZE) injection 100 mcg  100 mcg Intravenous Once Tatyana A Kirichenko, PA   100 mcg at 06/14/11 1234  . potassium chloride 10 mEq in 100 mL IVPB  10 mEq Intravenous Once Tatyana A Kirichenko, PA   10 mEq at 06/14/11 1314   Medications Prior to Admission  Medication Sig Dispense Refill  . amLODipine (NORVASC) 10 MG tablet Take 10 mg by mouth daily.      . insulin NPH-insulin regular (NOVOLIN 70/30) (70-30) 100 UNIT/ML injection Inject 30-50 Units into the skin 2 (two) times daily with a meal. 50 units in the morning; 30 units in the evening      . lisinopril-hydrochlorothiazide (PRINZIDE,ZESTORETIC) 10-12.5 MG per tablet Take 1 tablet by mouth daily.      Marland Kitchen oxyCODONE (ROXICODONE) 15 MG immediate release tablet Take 15 mg by mouth every 4 (four) hours as needed. For pain      . risperiDONE (RISPERDAL) 1 MG tablet Take 1-2 mg by mouth 2 (two) times daily. 1mg  in the morning; 2mg  in the  evening      . tiotropium (SPIRIVA) 18 MCG inhalation capsule Place 18 mcg into inhaler and inhale daily.        Results for orders placed during the hospital encounter of 06/14/11 (from the past 48 hour(s))  CBC     Status: Abnormal   Collection Time   06/14/11 11:48 AM      Component Value Range Comment   WBC 9.4  4.0 - 10.5 (K/uL)    RBC 4.22  3.87 - 5.11 (MIL/uL)    Hemoglobin 12.8  12.0 - 15.0 (g/dL)    HCT 16.1  09.6 - 04.5 (%)    MCV 88.9  78.0 - 100.0 (fL)    MCH 30.3  26.0 - 34.0 (pg)    MCHC 34.1  30.0 - 36.0 (g/dL)    RDW 40.9  81.1 - 91.4 (%)    Platelets 148 (*) 150 - 400 (K/uL)   BASIC METABOLIC PANEL     Status: Abnormal   Collection Time     06/14/11 11:48 AM      Component Value Range Comment   Sodium 136  135 - 145 (mEq/L)    Potassium 2.9 (*) 3.5 - 5.1 (mEq/L)    Chloride 98  96 - 112 (mEq/L)    CO2 28  19 - 32 (mEq/L)    Glucose, Bld 137 (*) 70 - 99 (mg/dL)    BUN 5 (*) 6 - 23 (mg/dL)    Creatinine, Ser 7.82 (*) 0.50 - 1.10 (mg/dL)    Calcium 95.6  8.4 - 10.5 (mg/dL)    GFR calc non Af Amer >90  >90 (mL/min)    GFR calc Af Amer >90  >90 (mL/min)    No results found.  Review of Systems  Constitutional: Positive for fever and malaise/fatigue.  HENT: Negative.   Eyes: Negative.   Respiratory: Negative.   Cardiovascular: Negative.   Gastrointestinal: Negative.   Genitourinary: Negative.   Musculoskeletal: Positive for back pain.  Skin: Negative.   Neurological: Negative.   Endo/Heme/Allergies: Negative.   Psychiatric/Behavioral: The patient has insomnia.     Blood pressure 110/78, pulse 92, temperature 98 F (36.7 C), temperature source Oral, resp. rate 18, last menstrual period 05/09/2011, SpO2 99.00%. Physical Exam hent, nl. Neck, nl. Lungs, some ronchii. Cv, nl. Abdomen. No masses. Extremities, grade 1 edema. musculo-skeletal, lumbar wound well healed. Slr, positive at 60. Reflexes nl.    Assessment/Plan Admission to 3000. ID and Hospitalist to consult for her ongoing treatment as well for her diabetes. Also, rehabilitation medicine. We will ger a lumbar mri c and s contrast  Noreene Boreman M 06/14/2011, 1:22 PM

## 2011-06-14 NOTE — ED Notes (Signed)
[id number] Ready

## 2011-06-14 NOTE — ED Notes (Signed)
EMS called for back pain patient recently had surgery March 1st 2013 ran out of her pain medication and started to have severe back pain 4 days ago.  Patient has a right side PICC line access by EMS given 4 mg zofran and 10mg  morphine. Pain prior to medication and after 10/10 sharp however patient stated does feel better.

## 2011-06-14 NOTE — ED Notes (Signed)
REPORT CALLED TO TRACY

## 2011-06-14 NOTE — Telephone Encounter (Signed)
Pt hospitalized 06/13/11.  Mother wondering how to get in touch with ID MD while pt is hospitalized.  RN advised mother to talk w/ admitting MD and ask to request an ID Consult.  Mother verbalized understanding.  Mother canceled [facility] appt for tomorrow.

## 2011-06-14 NOTE — Progress Notes (Signed)
Patient ID: Julia [name], female   DOB: 11/21/1965, 46 y.o.   MRN: [id number] Mri of the lumbar spine showed discitis as well as fluid collection with displacement of the thecal sac. I did speak with the patient, mother and sister about the findings specially the progression of the disease with vancomycin iv. I will take her to surgery tomorrow to i /d the lumbar area. They are aware that i might leave the wound open to close as secondary intention. ALSO I WILL CALLid AND HOSPITALIST  To help in ms [place]'care

## 2011-06-14 NOTE — ED Notes (Signed)
HAVE MADE CALLS TO DR Jeral Fruit OFFICE AND TO NEURO ICU AND OR TRYING TO REACH HIM ABOUT FURTHER ORDERS AND TO REQUEST A FOLEY FOR PT AT HER REQUEST. MD OFFICE WOULD NOT GIVE OUT HIS PAGER NUMBER OR CALL HIM FOR [place] AFTER AT LEAST 3 REQEUSTS WERE MADE. STILL TRYING TO REACH PT DR Jeral Fruit

## 2011-06-14 NOTE — ED Provider Notes (Signed)
Medical screening examination/treatment/procedure(s) were conducted as a shared visit with non-physician practitioner(s) and myself.  I personally evaluated the patient during the encounter  Cheri Guppy, MD 06/14/11 1600

## 2011-06-14 NOTE — Progress Notes (Signed)
ANTIBIOTIC CONSULT NOTE - INITIAL  Pharmacy Consult for vancomycin  Indication: lumbar discitis   Allergies  Allergen Reactions  . Codeine Hives  . Gabapentin Rash    Patient Measurements:   Wt 98.4 kg  Vital Signs: Temp: 97.9 F (36.6 C) (03/26 1807) Temp src: Oral (03/26 1807) BP: 118/73 mmHg (03/26 1807) Pulse Rate: 98  (03/26 1807) Intake/Output from previous day:   Intake/Output from this shift:    Labs:  Basename 06/14/11 1148  WBC 9.4  HGB 12.8  PLT 148*  LABCREA --  CREATININE 0.36*      Microbiology: Recent Results (from the past 720 hour(s))  SURGICAL PCR SCREEN     Status: Normal   Collection Time   05/20/11  9:02 AM      Component Value Range Status Comment   MRSA, PCR NEGATIVE  NEGATIVE  Final    Staphylococcus aureus NEGATIVE  NEGATIVE  Final   TISSUE CULTURE     Status: Normal   Collection Time   05/20/11  4:21 PM      Component Value Range Status Comment   Specimen Description TISSUE BACK   Final    Special Requests     Final    Value: PATIENT ON FOLLOWING ANCEF LUMBAR WOUND DEBRIDEMENT   Gram Stain     Final    Value: RARE WBC PRESENT, PREDOMINANTLY PMN     NO ORGANISMS SEEN   Culture NO GROWTH 3 DAYS   Final    Report Status 05/24/2011 FINAL   Final   ANAEROBIC CULTURE     Status: Normal   Collection Time   05/20/11  4:22 PM      Component Value Range Status Comment   Specimen Description TISSUE BACK   Final    Special Requests     Final    Value: PATIENT ON FOLLOWING ANCEF LUMBAR WOUND DEBRIDEMENT   Gram Stain     Final    Value: RARE WBC PRESENT, PREDOMINANTLY PMN     NO ORGANISMS SEEN   Culture NO ANAEROBES ISOLATED   Final    Report Status 05/25/2011 FINAL   Final   WOUND CULTURE     Status: Normal   Collection Time   05/20/11  4:24 PM      Component Value Range Status Comment   Specimen Description WOUND BACK   Final    Special Requests PT ON ANCEF   Final    Gram Stain     Final    Value: MODERATE WBC PRESENT, PREDOMINANTLY  PMN     RARE SQUAMOUS EPITHELIAL CELLS PRESENT     NO ORGANISMS SEEN   Culture NO GROWTH 2 DAYS   Final    Report Status 05/23/2011 FINAL   Final   ANAEROBIC CULTURE     Status: Normal   Collection Time   05/20/11  4:24 PM      Component Value Range Status Comment   Specimen Description WOUND BACK   Final    Special Requests PT ON ANCEF   Final    Gram Stain     Final    Value: MODERATE WBC PRESENT, PREDOMINANTLY PMN     RARE SQUAMOUS EPITHELIAL CELLS PRESENT     NO ORGANISMS SEEN   Culture NO ANAEROBES ISOLATED   Final    Report Status 05/25/2011 FINAL   Final     Medical History: Past Medical History  Diagnosis Date  . Hypertension   . Diabetes mellitus   .  COPD (chronic obstructive pulmonary disease)   . Asthma   . Shortness of breath   . Headache   . Arthritis   . Bipolar affective disorder   . Fibromyalgia   . Cirrhosis of liver   . Anxiety   . Diabetes mellitus 05/20/2011  . HTN (hypertension) 05/20/2011  . S/P diskectomy 05/20/2011  . Cirrhosis of liver   . Bipolar affective disorder     Medications:  Prescriptions prior to admission  Medication Sig Dispense Refill  . amLODipine (NORVASC) 10 MG tablet Take 10 mg by mouth daily.      . carisoprodol (SOMA) 350 MG tablet Take 350 mg by mouth 2 (two) times daily as needed. For pain.      Marland Kitchen ibuprofen (ADVIL,MOTRIN) 200 MG tablet Take 200 mg by mouth every 6 (six) hours as needed. For pain.      Marland Kitchen insulin NPH-insulin regular (NOVOLIN 70/30) (70-30) 100 UNIT/ML injection Inject 30-50 Units into the skin 2 (two) times daily with a meal. 50 units in the morning; 30 units in the evening      . lisinopril-hydrochlorothiazide (PRINZIDE,ZESTORETIC) 10-12.5 MG per tablet Take 1 tablet by mouth daily.      Marland Kitchen oxyCODONE (ROXICODONE) 15 MG immediate release tablet Take 15 mg by mouth every 4 (four) hours as needed. For pain      . risperiDONE (RISPERDAL) 1 MG tablet Take 1-2 mg by mouth 2 (two) times daily. 1mg  in the morning; 2mg  in  the evening      . tiotropium (SPIRIVA) 18 MCG inhalation capsule Place 18 mcg into inhaler and inhale daily.       Assessment: 46 yo F s/p original diskectomy 04/10/11. Back to OR 05/04/11 for nerve compression.  She was started on ancef and cipro and was changed to Rocephin 2 gm IV q 24 on 05/21/11 by ID to continue as outpt via PICC line for 4 weeks to end on 06/17/11.  Admitted today with worsening pain and unable to walk.  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 Paraspinous abscess on the left and subcutaneous rim enhancing fluid collection also consistent with abscess is identified. "   Goal of Therapy:  Vancomycin trough level 15-20 mcg/ml  Plan:  Vancomycin 1000 mg IV q8 hrs. Will plan to check a steady-state trough as anticipate long-term antibiotics. Herby Abraham, Pharm.D. [phone] 06/14/2011 7:41 PM   Len Childs T 06/14/2011,7:36 PM

## 2011-06-14 NOTE — ED Provider Notes (Signed)
History     CSN: [id number]  Arrival date & time 06/14/11  1037   First MD Initiated Contact with Patient 06/14/11 1046      Chief Complaint  Patient presents with  . Back Pain    (Consider location/radiation/quality/duration/timing/severity/associated sxs/prior treatment) Patient is a 46 y.o. female presenting with back pain. The history is provided by the patient.  Back Pain  This is a recurrent problem. The current episode started more than 2 days ago. The problem occurs constantly. The problem has been gradually worsening. The pain is present in the lumbar spine. The quality of the pain is described as stabbing. The pain radiates to the right thigh and left thigh. The pain is at a severity of 8/10. The pain is moderate. The symptoms are aggravated by bending, twisting and certain positions. Pertinent negatives include no chest pain, no fever, no numbness, no abdominal pain, no abdominal swelling, no bowel incontinence, no perianal numbness, no dysuria, no pelvic pain and no weakness.  pt with original disckectomy on 04/10/11. States had to be taken back to surgery on 2/17 for persistent nerve compression. States was then admitted 05/20/11 for post operative infection and had to have wound I&ded again.  States was doing well. On IV antibiotics through her PICC line at home. Was ambulating, doing therapy. States pain progressively worsened 5 days ago. Since then, it has been worsening. Stats now unable to walk or get out of bed. Pt denies fever, chills, malaise. Denies urinary symptoms. NO loss of bowels or urinary retention, no leg numbness.  Past Medical History  Diagnosis Date  . Hypertension   . Diabetes mellitus   . COPD (chronic obstructive pulmonary disease)   . Asthma   . Shortness of breath   . Headache   . Arthritis   . Bipolar affective disorder   . Fibromyalgia   . Cirrhosis of liver   . Anxiety   . Diabetes mellitus 05/20/2011  . HTN (hypertension) 05/20/2011  . S/P  diskectomy 05/20/2011  . Cirrhosis of liver   . Bipolar affective disorder     Past Surgical History  Procedure Date  . Neck surgery   . Cesarean section   . Tonsillectomy   . Back surgery     x2  . Knee arthroscopy     L  . Lumbar laminectomy/decompression microdiscectomy 04/20/2011    Procedure: LUMBAR LAMINECTOMY/DECOMPRESSION MICRODISCECTOMY;  Surgeon: Karn Cassis, MD;  Location: [facility];  Service: Neurosurgery;  Laterality: Right;  Right Lumbar four- five Diskectomy  . Lumbar laminectomy/decompression microdiscectomy 05/08/2011    Procedure: LUMBAR LAMINECTOMY/DECOMPRESSION MICRODISCECTOMY 1 LEVEL;  Surgeon: Clydene Fake, MD;  Location: [facility];  Service: Neurosurgery;  Laterality: N/A;  Lumbar four-five, Redo Lumbar Laminectomy, Repair of CSF Leak  . Lumbar wound debridement 05/20/2011    Procedure: LUMBAR WOUND DEBRIDEMENT;  Surgeon: Karn Cassis, MD;  Location: [facility];  Service: Neurosurgery;  Laterality: N/A;  Incision and Drainage of Lumbar wound    No family history on file.  History  Substance Use Topics  . Smoking status: Current Everyday Smoker -- 0.5 packs/day  . Smokeless tobacco: Not on file  . Alcohol Use: No    OB History    Grav Para Term Preterm Abortions TAB SAB Ect Mult Living                  Review of Systems  Constitutional: Negative for fever and chills.  HENT: Negative.   Eyes:  Negative.   Respiratory: Negative.   Cardiovascular: Negative for chest pain.  Gastrointestinal: Negative.  Negative for abdominal pain and bowel incontinence.  Genitourinary: Negative for dysuria, flank pain and pelvic pain.  Musculoskeletal: Positive for back pain and gait problem.  Skin: Negative.   Neurological: Negative for dizziness, weakness and numbness.  Psychiatric/Behavioral: Negative.     Allergies  Codeine and Gabapentin  Home Medications   Current Outpatient Rx  Name Route Sig Dispense Refill  . AMLODIPINE BESYLATE 10 MG PO  TABS Oral Take 10 mg by mouth daily.    Marland Kitchen CARISOPRODOL 350 MG PO TABS Oral Take 350 mg by mouth 2 (two) times daily as needed. For pain.    . IBUPROFEN 200 MG PO TABS Oral Take 200 mg by mouth every 6 (six) hours as needed. For pain.    . INSULIN ISOPHANE & REGULAR (70-30) 100 UNIT/ML [place] SUSP Subcutaneous Inject 30-50 Units into the skin 2 (two) times daily with a meal. 50 units in the morning; 30 units in the evening    . LISINOPRIL-HYDROCHLOROTHIAZIDE 10-12.5 MG PO TABS Oral Take 1 tablet by mouth daily.    . OXYCODONE HCL 15 MG PO TABS Oral Take 15 mg by mouth every 4 (four) hours as needed. For pain    . RISPERIDONE 1 MG PO TABS Oral Take 1-2 mg by mouth 2 (two) times daily. 1mg  in the morning; 2mg  in the evening    . TIOTROPIUM BROMIDE MONOHYDRATE 18 MCG IN CAPS Inhalation Place 18 mcg into inhaler and inhale daily.      BP 110/78  Pulse 92  Temp(Src) 98 F (36.7 C) (Oral)  Resp 18  SpO2 99%  LMP 05/09/2011  Physical Exam  Nursing note and vitals reviewed. Constitutional: She is oriented to person, place, and time. She appears well-developed and well-nourished.       Pt uncomfortable appearing, tearful  HENT:  Head: Normocephalic and atraumatic.  Eyes: Conjunctivae are normal.  Neck: Neck supple.  Cardiovascular: Normal rate, regular rhythm and normal heart sounds.   Pulmonary/Chest: Effort normal.  Abdominal: Soft. Bowel sounds are normal. She exhibits no distension.  Musculoskeletal: She exhibits no edema.       There is a healing surgical incision to the lumbar spine, appears well with no surrounding swelling, redness, drainage. No tenderness to palpation over that area. Pain with bilateral straight leg raise.   Neurological: She is alert and oriented to person, place, and time. No cranial nerve deficit.       5/5 and equal LE strength. Pt able to dorsiflex bilateral feet. Good sensation to the thighs bilaterally  Skin: Skin is warm and dry.  Psychiatric: She has a normal  mood and affect.    ED Course  Procedures (including critical care time)  12:27 PM I spoke with Dr.Botero who is planning on admitting pt. Asked for MR w/wo contrast.  Will move to CDU.     No diagnosis found.    MDM          Lottie Mussel, PA 06/14/11 1542

## 2011-06-14 NOTE — ED Provider Notes (Signed)
Medical screening examination/treatment/procedure(s) were conducted as a shared visit with non-physician practitioner(s) and myself.  I personally evaluated the patient during the encounter Hx of back surgery in Jan. 13.  Complicated by infx. Had to have repeat back surg. X2.  Now increased back pain and weakness.  Will get mri and consult dr. Jeral Fruit.   Cheri Guppy, MD 06/14/11 [phone]

## 2011-06-15 ENCOUNTER — Encounter (HOSPITAL_COMMUNITY): Payer: Self-pay | Admitting: Internal Medicine

## 2011-06-15 ENCOUNTER — Ambulatory Visit: Payer: Medicaid Other | Admitting: Infectious Disease

## 2011-06-15 ENCOUNTER — Encounter (HOSPITAL_COMMUNITY): Payer: Self-pay | Admitting: Anesthesiology

## 2011-06-15 ENCOUNTER — Encounter (HOSPITAL_COMMUNITY)
Admission: EM | Disposition: A | Discharge status: Skilled Nursing Facility | Payer: Self-pay | Source: Home / Self Care | Attending: Neurosurgery

## 2011-06-15 ENCOUNTER — Inpatient Hospital Stay (HOSPITAL_COMMUNITY): Payer: Medicaid Other | Admitting: Anesthesiology

## 2011-06-15 DIAGNOSIS — Y849 Medical procedure, unspecified as the cause of abnormal reaction of the patient, or of later complication, without mention of misadventure at the time of the procedure: Secondary | ICD-10-CM

## 2011-06-15 DIAGNOSIS — T8140XA Infection following a procedure, unspecified, initial encounter: Secondary | ICD-10-CM

## 2011-06-15 HISTORY — PX: LUMBAR WOUND DEBRIDEMENT: SHX1988

## 2011-06-15 LAB — GRAM STAIN

## 2011-06-15 LAB — CBC
HCT: 31.9 % — ABNORMAL LOW (ref 36.0–46.0)
Hemoglobin: 10.5 g/dL — ABNORMAL LOW (ref 12.0–15.0)
MCH: 29.7 pg (ref 26.0–34.0)
MCHC: 32.9 g/dL (ref 30.0–36.0)
MCV: 90.4 fL (ref 78.0–100.0)
RBC: 3.53 MIL/uL — ABNORMAL LOW (ref 3.87–5.11)

## 2011-06-15 LAB — BASIC METABOLIC PANEL
BUN: 6 mg/dL (ref 6–23)
CO2: 27 mEq/L (ref 19–32)
Calcium: 9.2 mg/dL (ref 8.4–10.5)
GFR calc non Af Amer: >90 mL/min (ref >90)
Glucose, Bld: 123 mg/dL — ABNORMAL HIGH (ref 70–99)

## 2011-06-15 LAB — GLUCOSE, CAPILLARY: Glucose-Capillary: 107 mg/dL — ABNORMAL HIGH (ref 70–99)

## 2011-06-15 SURGERY — LUMBAR WOUND DEBRIDEMENT
Anesthesia: General | Site: Back | Wound class: Dirty or Infected

## 2011-06-15 MED ORDER — THROMBIN 5000 UNITS EX KIT
PACK | CUTANEOUS | Status: DC | PRN
  Administered 2011-06-15 (×3): 5000 [IU] via TOPICAL

## 2011-06-15 MED ORDER — DIAZEPAM 5 MG PO TABS
10.0000 mg | ORAL_TABLET | Freq: Three times a day (TID) | ORAL | Status: DC | PRN
  Administered 2011-06-15 – 2011-06-27 (×29): 10 mg via ORAL
  Filled 2011-06-15 (×15): qty 2
  Filled 2011-06-15: qty 1
  Filled 2011-06-15 (×2): qty 2
  Filled 2011-06-15: qty 1
  Filled 2011-06-15 (×3): qty 2
  Filled 2011-06-15: qty 1
  Filled 2011-06-15 (×8): qty 2

## 2011-06-15 MED ORDER — SODIUM CHLORIDE 0.9 % IV SOLN
INTRAVENOUS | Status: DC
  Administered 2011-06-16 – 2011-06-18 (×3): via INTRAVENOUS

## 2011-06-15 MED ORDER — ZOLPIDEM TARTRATE 10 MG PO TABS
10.0000 mg | ORAL_TABLET | Freq: Every evening | ORAL | Status: DC | PRN
  Administered 2011-06-18 – 2011-06-27 (×8): 10 mg via ORAL
  Filled 2011-06-15 (×8): qty 1
  Filled 2011-06-15: qty 2

## 2011-06-15 MED ORDER — MENTHOL 3 MG MT LOZG: 1.0000 | LOZENGE | OROMUCOSAL | Status: DC | PRN

## 2011-06-15 MED ORDER — DEXTROSE 5 % IV SOLN
2.0000 g | Freq: Two times a day (BID) | INTRAVENOUS | Status: DC
  Administered 2011-06-16 – 2011-06-27 (×24): 2 g via INTRAVENOUS
  Filled 2011-06-15 (×26): qty 2

## 2011-06-15 MED ORDER — NEOSTIGMINE METHYLSULFATE 1 MG/ML IJ SOLN
INTRAMUSCULAR | Status: DC | PRN
  Administered 2011-06-15: 5 mg via INTRAVENOUS

## 2011-06-15 MED ORDER — PROPOFOL 10 MG/ML IV EMUL
INTRAVENOUS | Status: DC | PRN
  Administered 2011-06-15: 200 mg via INTRAVENOUS
  Administered 2011-06-15 (×2): 20 mg via INTRAVENOUS

## 2011-06-15 MED ORDER — DOCUSATE SODIUM 100 MG PO CAPS
100.0000 mg | ORAL_CAPSULE | Freq: Two times a day (BID) | ORAL | Status: DC
  Administered 2011-06-15 – 2011-06-27 (×22): 100 mg via ORAL
  Filled 2011-06-15 (×21): qty 1

## 2011-06-15 MED ORDER — ACETAMINOPHEN 650 MG RE SUPP: 650.0000 mg | RECTAL | Status: DC | PRN

## 2011-06-15 MED ORDER — HYDROMORPHONE HCL PF 1 MG/ML IJ SOLN
0.2500 mg | INTRAMUSCULAR | Status: DC | PRN
  Administered 2011-06-15 (×3): 0.5 mg via INTRAVENOUS

## 2011-06-15 MED ORDER — HYDROMORPHONE HCL PF 1 MG/ML IJ SOLN
INTRAMUSCULAR | Status: AC
  Filled 2011-06-15: qty 1

## 2011-06-15 MED ORDER — BISACODYL 5 MG PO TBEC
5.0000 mg | DELAYED_RELEASE_TABLET | Freq: Every day | ORAL | Status: DC | PRN
  Filled 2011-06-15: qty 1

## 2011-06-15 MED ORDER — ONDANSETRON HCL 4 MG/2ML IJ SOLN
INTRAMUSCULAR | Status: DC | PRN
  Administered 2011-06-15: 4 mg via INTRAVENOUS

## 2011-06-15 MED ORDER — FLEET ENEMA 7-19 GM/118ML RE ENEM
1.0000 | ENEMA | Freq: Once | RECTAL | Status: AC | PRN
  Filled 2011-06-15: qty 1

## 2011-06-15 MED ORDER — LACTATED RINGERS IV SOLN
INTRAVENOUS | Status: DC | PRN
  Administered 2011-06-15 (×2): via INTRAVENOUS

## 2011-06-15 MED ORDER — SODIUM CHLORIDE 0.9 % IJ SOLN
3.0000 mL | Freq: Two times a day (BID) | INTRAMUSCULAR | Status: DC
  Administered 2011-06-15 – 2011-06-22 (×8): 3 mL via INTRAVENOUS

## 2011-06-15 MED ORDER — SODIUM CHLORIDE 0.9 % IJ SOLN: 3.0000 mL | INTRAMUSCULAR | Status: DC | PRN

## 2011-06-15 MED ORDER — GLYCOPYRROLATE 0.2 MG/ML IJ SOLN
INTRAMUSCULAR | Status: DC | PRN
  Administered 2011-06-15: .8 mg via INTRAVENOUS

## 2011-06-15 MED ORDER — DIAZEPAM 5 MG PO TABS: 10.0000 mg | ORAL_TABLET | Freq: Three times a day (TID) | ORAL | Status: DC | PRN

## 2011-06-15 MED ORDER — MORPHINE SULFATE 4 MG/ML IJ SOLN
4.0000 mg | INTRAMUSCULAR | Status: DC | PRN
  Administered 2011-06-15 – 2011-06-16 (×2): 2 mg via INTRAVENOUS
  Administered 2011-06-16: 4 mg via INTRAVENOUS
  Administered 2011-06-16: 2 mg via INTRAVENOUS
  Administered 2011-06-16: 4 mg via INTRAVENOUS
  Administered 2011-06-16: 2 mg via INTRAVENOUS
  Administered 2011-06-16 – 2011-06-17 (×2): 4 mg via INTRAVENOUS
  Administered 2011-06-17 (×2): 2 mg via INTRAVENOUS
  Administered 2011-06-17 – 2011-06-27 (×47): 4 mg via INTRAVENOUS
  Filled 2011-06-15 (×56): qty 1

## 2011-06-15 MED ORDER — METOCLOPRAMIDE HCL 5 MG/ML IJ SOLN
INTRAMUSCULAR | Status: DC | PRN
  Administered 2011-06-15: 10 mg via INTRAVENOUS

## 2011-06-15 MED ORDER — SODIUM CHLORIDE 0.9 % IJ SOLN
10.0000 mL | INTRAMUSCULAR | Status: DC | PRN
  Administered 2011-06-15 – 2011-06-22 (×2): 10 mL

## 2011-06-15 MED ORDER — SODIUM CHLORIDE 0.9 % IV SOLN: INTRAVENOUS | Status: DC

## 2011-06-15 MED ORDER — SODIUM CHLORIDE 0.9 % IJ SOLN: 3.0000 mL | Freq: Two times a day (BID) | INTRAMUSCULAR | Status: DC

## 2011-06-15 MED ORDER — 0.9 % SODIUM CHLORIDE (POUR BTL) OPTIME
TOPICAL | Status: DC | PRN
  Administered 2011-06-15: 1000 mL

## 2011-06-15 MED ORDER — SODIUM CHLORIDE 0.9 % IV SOLN: 250.0000 mL | INTRAVENOUS | Status: DC

## 2011-06-15 MED ORDER — ONDANSETRON HCL 4 MG/2ML IJ SOLN: 4.0000 mg | INTRAMUSCULAR | Status: DC | PRN

## 2011-06-15 MED ORDER — THROMBIN 5000 UNITS EX SOLR
OROMUCOSAL | Status: DC | PRN
  Administered 2011-06-15: 20:00:00 via TOPICAL

## 2011-06-15 MED ORDER — HEMOSTATIC AGENTS (NO CHARGE) OPTIME
TOPICAL | Status: DC | PRN
  Administered 2011-06-15: 1 via TOPICAL

## 2011-06-15 MED ORDER — METOCLOPRAMIDE HCL 5 MG/ML IJ SOLN
10.0000 mg | Freq: Once | INTRAMUSCULAR | Status: DC | PRN
Start: 2011-06-15 — End: 2011-06-15

## 2011-06-15 MED ORDER — ZOLPIDEM TARTRATE 5 MG PO TABS: 10.0000 mg | ORAL_TABLET | Freq: Every evening | ORAL | Status: DC | PRN

## 2011-06-15 MED ORDER — MORPHINE SULFATE 4 MG/ML IJ SOLN: 4.0000 mg | INTRAMUSCULAR | Status: DC | PRN

## 2011-06-15 MED ORDER — ROCURONIUM BROMIDE 100 MG/10ML IV SOLN
INTRAVENOUS | Status: DC | PRN
  Administered 2011-06-15: 30 mg via INTRAVENOUS

## 2011-06-15 MED ORDER — FENTANYL CITRATE 0.05 MG/ML IJ SOLN
INTRAMUSCULAR | Status: DC | PRN
  Administered 2011-06-15: 100 ug via INTRAVENOUS
  Administered 2011-06-15 (×3): 50 ug via INTRAVENOUS

## 2011-06-15 MED ORDER — PHENOL 1.4 % MT LIQD: 1.0000 | OROMUCOSAL | Status: DC | PRN

## 2011-06-15 MED ORDER — ACETAMINOPHEN 325 MG PO TABS
650.0000 mg | ORAL_TABLET | ORAL | Status: DC | PRN
  Administered 2011-06-16 – 2011-06-26 (×14): 650 mg via ORAL
  Filled 2011-06-15 (×14): qty 2

## 2011-06-15 SURGICAL SUPPLY — 69 items
APL SKNCLS STERI-STRIP NONHPOA (GAUZE/BANDAGES/DRESSINGS)
BENZOIN TINCTURE PRP APPL 2/3 (GAUZE/BANDAGES/DRESSINGS) IMPLANT
BLADE SURG ROTATE 9660 (MISCELLANEOUS) IMPLANT
BUR ACORN 6.0 PRECISION (BURR) ×1 IMPLANT
BUR MATCHSTICK NEURO 3.0 LAGG (BURR) ×1 IMPLANT
CANISTER SUCTION 2500CC (MISCELLANEOUS) ×2 IMPLANT
CLOTH BEACON ORANGE TIMEOUT ST (SAFETY) ×3 IMPLANT
CONT SPEC 4OZ CLIKSEAL STRL BL (MISCELLANEOUS) ×1 IMPLANT
DRAPE LAPAROTOMY 100X72X124 (DRAPES) ×2 IMPLANT
DRAPE MICROSCOPE LEICA (MISCELLANEOUS) ×1 IMPLANT
DRAPE POUCH INSTRU U-SHP 10X18 (DRAPES) ×2 IMPLANT
DRESSING TELFA 8X3 (GAUZE/BANDAGES/DRESSINGS) ×1 IMPLANT
DURAPREP 26ML APPLICATOR (WOUND CARE) ×1 IMPLANT
ELECT REM PT RETURN 9FT ADLT (ELECTROSURGICAL) ×2
ELECTRODE REM PT RTRN 9FT ADLT (ELECTROSURGICAL) ×1 IMPLANT
EVACUATOR 3/16  PVC DRAIN (DRAIN) ×1
EVACUATOR 3/16 PVC DRAIN (DRAIN) IMPLANT
GAUZE SPONGE 4X4 16PLY XRAY LF (GAUZE/BANDAGES/DRESSINGS) IMPLANT
GLOVE BIO SURGEON STRL SZ 6.5 (GLOVE) ×2 IMPLANT
GLOVE BIO SURGEON STRL SZ7 (GLOVE) IMPLANT
GLOVE BIO SURGEON STRL SZ7.5 (GLOVE) IMPLANT
GLOVE BIO SURGEON STRL SZ8 (GLOVE) IMPLANT
GLOVE BIO SURGEON STRL SZ8.5 (GLOVE) IMPLANT
GLOVE BIOGEL M 8.0 STRL (GLOVE) ×2 IMPLANT
GLOVE BIOGEL PI IND STRL 6.5 (GLOVE) IMPLANT
GLOVE BIOGEL PI IND STRL 7.5 (GLOVE) IMPLANT
GLOVE BIOGEL PI INDICATOR 6.5 (GLOVE) ×1
GLOVE BIOGEL PI INDICATOR 7.5 (GLOVE) ×1
GLOVE ECLIPSE 6.5 STRL STRAW (GLOVE) ×1 IMPLANT
GLOVE ECLIPSE 7.0 STRL STRAW (GLOVE) IMPLANT
GLOVE ECLIPSE 7.5 STRL STRAW (GLOVE) ×3 IMPLANT
GLOVE ECLIPSE 8.0 STRL XLNG CF (GLOVE) IMPLANT
GLOVE ECLIPSE 8.5 STRL (GLOVE) IMPLANT
GLOVE EXAM NITRILE LRG STRL (GLOVE) IMPLANT
GLOVE EXAM NITRILE MD LF STRL (GLOVE) IMPLANT
GLOVE EXAM NITRILE XL STR (GLOVE) IMPLANT
GLOVE EXAM NITRILE XS STR PU (GLOVE) IMPLANT
GLOVE INDICATOR 6.5 STRL GRN (GLOVE) IMPLANT
GLOVE INDICATOR 7.0 STRL GRN (GLOVE) IMPLANT
GLOVE INDICATOR 7.5 STRL GRN (GLOVE) IMPLANT
GLOVE INDICATOR 8.0 STRL GRN (GLOVE) IMPLANT
GLOVE INDICATOR 8.5 STRL (GLOVE) IMPLANT
GLOVE OPTIFIT SS 8.0 STRL (GLOVE) IMPLANT
GLOVE SURG SS PI 6.5 STRL IVOR (GLOVE) IMPLANT
GOWN BRE IMP SLV AUR LG STRL (GOWN DISPOSABLE) ×2 IMPLANT
GOWN BRE IMP SLV AUR XL STRL (GOWN DISPOSABLE) ×1 IMPLANT
GOWN STRL REIN 2XL LVL4 (GOWN DISPOSABLE) IMPLANT
HEMOSTAT POWDER SURGIFOAM 1G (HEMOSTASIS) ×1 IMPLANT
KIT BASIN OR (CUSTOM PROCEDURE TRAY) ×2 IMPLANT
KIT ROOM TURNOVER OR (KITS) ×2 IMPLANT
NS IRRIG 1000ML POUR BTL (IV SOLUTION) ×2 IMPLANT
PACK LAMINECTOMY NEURO (CUSTOM PROCEDURE TRAY) ×2 IMPLANT
PAD ARMBOARD 7.5X6 YLW CONV (MISCELLANEOUS) ×6 IMPLANT
RUBBERBAND STERILE (MISCELLANEOUS) ×2 IMPLANT
SPONGE GAUZE 4X4 12PLY (GAUZE/BANDAGES/DRESSINGS) ×2 IMPLANT
SPONGE SURGIFOAM ABS GEL SZ50 (HEMOSTASIS) ×2 IMPLANT
STAPLER SKIN PROX WIDE 3.9 (STAPLE) ×1 IMPLANT
STRIP CLOSURE SKIN 1/2X4 (GAUZE/BANDAGES/DRESSINGS) IMPLANT
SUT VIC AB 0 CT1 18XCR BRD8 (SUTURE) ×1 IMPLANT
SUT VIC AB 0 CT1 8-18 (SUTURE) ×2
SUT VIC AB 2-0 CP2 18 (SUTURE) ×2 IMPLANT
SUT VIC AB 3-0 SH 8-18 (SUTURE) ×2 IMPLANT
SWAB CULTURE LIQ STUART DBL (MISCELLANEOUS) ×2 IMPLANT
SYR 20ML ECCENTRIC (SYRINGE) ×2 IMPLANT
TAPE CLOTH SURG 4X10 WHT LF (GAUZE/BANDAGES/DRESSINGS) ×1 IMPLANT
TOWEL OR 17X24 6PK STRL BLUE (TOWEL DISPOSABLE) ×2 IMPLANT
TOWEL OR 17X26 10 PK STRL BLUE (TOWEL DISPOSABLE) ×2 IMPLANT
TUBE ANAEROBIC SPECIMEN COL (MISCELLANEOUS) ×2 IMPLANT
WATER STERILE IRR 1000ML POUR (IV SOLUTION) ×2 IMPLANT

## 2011-06-15 NOTE — Progress Notes (Signed)
bialteral l4 5 laminectomies done. Specimen to lab. Op note [phone]

## 2011-06-15 NOTE — Preoperative (Signed)
Beta Blockers   Reason not to administer Beta Blockers:Not Applicable 

## 2011-06-15 NOTE — Anesthesia Procedure Notes (Signed)
Procedure Name: Intubation Date/Time: 06/15/2011 6:23 PM Performed by: Glendora Score A Pre-anesthesia Checklist: Patient identified, Emergency Drugs available, Suction available and Patient being monitored Patient Re-evaluated:Patient Re-evaluated prior to inductionOxygen Delivery Method: Circle system utilized Preoxygenation: Pre-oxygenation with 100% oxygen Intubation Type: IV induction and Cricoid Pressure applied Ventilation: Mask ventilation without difficulty and Oral airway inserted - appropriate to patient size Laryngoscope Size: Hyacinth Meeker and 2 Grade View: Grade I Tube type: Oral Tube size: 7.0 mm Number of attempts: 1 Airway Equipment and Method: Stylet Placement Confirmation: ETT inserted through vocal cords under direct vision,  positive ETCO2 and breath sounds checked- equal and bilateral Secured at: 21 cm Tube secured with: Tape Dental Injury: Teeth and Oropharynx as per pre-operative assessment

## 2011-06-15 NOTE — Consult Note (Signed)
Infectious disease Initial consult note  Chief complaint: Osteomyelitis/abscess  Reason of consultation: antibiotics choice and duration  HPI: Mrs Julia [name] is a 46 yo patient with a past medical hx of diabetes,COPD and liver cirrhosis. 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 She underwent two diskectomies at the level of L4/L5 by Dr Jeral Fruit on January 30 and by Dr Phoebe Perch on February 17. Cultures at that time failed to grow any particular organism. The surgical wound failed to heal completely and drained copious fluid. A fluid sample was sent for culture by Dr Jeral Fruit on February 26 who was following up the patient in the clinic and grew Enterococcus, resistant to ancef, and cefoxitin and sensitive to ceftriaxone, ceftaz, zosyn, imipenem, cipro. TMP/SMX, tobra and gentamicin.Ciproflocaxin was prescribed which however did not relieve the wound discharge and the patient was admitted on 03/01 for wound irrigation and debridement. Wound cultures did not reveal the organism after ciprofloxacin and perioperative cefazolin. ID was consulted and Dr Daiva Eves suggested that the patient should receive a 4 week course of IV ceftriaxone. The patient was followed up in the ID clinic and seemed to be improving but she finally presented again on 03/26 with fever and back pain. A lumbar MRI revealed a large phlegmon at the L4/L5 level consistent with osteomyelitis and epidural and paraspinal abscesses. The patient was empirically switched to vancomycin and underwent a repeat I and D by Dr Jeral Fruit. Wound cultures are sent to the lab.  Review of systems  Constitutional: No weight loss, night sweats, Fevers, chills, fatigue.  HEENT: No headaches, Difficulty swallowing,Tooth/dental problems,Sore throat, No sneezing, itching, ear ache, nasal congestion, post nasal drip,  Cardio-vascular: No chest pain, Orthopnea, PND, swelling in  lower extremities, anasarca, dizziness, palpitations  GI: No heartburn, indigestion, abdominal pain, nausea, vomiting, diarrhea, change in bowel habits, loss of appetite  Resp: No shortness of breath with exertion or at rest. No excess mucus, no productive cough, No non-productive cough, No coughing up of blood.No change in color of mucus.No wheezing.No chest wall deformity  Skin: no rash or lesions.  GU: no dysuria, change in color of urine, no urgency or frequency. No flank pain.  Musculoskeletal: No joint pain or swelling. No decreased range of motion. Positive for back pain and leg weakness.  Psych: No change in mood or affect. No depression or anxiety. No memory loss.   Past Medical History  Diagnosis Date  . Hypertension   . Diabetes mellitus   . COPD (chronic obstructive pulmonary disease)   . Asthma   . Shortness of breath   . Headache   . Arthritis   . Bipolar affective disorder   . Fibromyalgia   . Cirrhosis of liver   . Anxiety   . Diabetes mellitus 05/20/2011  . HTN (hypertension) 05/20/2011  . S/P diskectomy 05/20/2011  . Cirrhosis of liver   . Bipolar affective disorder   . Bipolar 1 disorder    Past Surgical History  Procedure Date  . Neck surgery   . Cesarean section   . Tonsillectomy   . Back surgery     x2  . Knee arthroscopy     L  . Lumbar laminectomy/decompression microdiscectomy 04/20/2011    Procedure: LUMBAR LAMINECTOMY/DECOMPRESSION MICRODISCECTOMY;  Surgeon: Karn Cassis, MD;  Location: [facility];  Service: Neurosurgery;  Laterality: Right;  Right Lumbar four- five Diskectomy  . Lumbar laminectomy/decompression microdiscectomy 05/08/2011  Procedure: LUMBAR LAMINECTOMY/DECOMPRESSION MICRODISCECTOMY 1 LEVEL;  Surgeon: Clydene Fake, MD;  Location: [facility];  Service: Neurosurgery;  Laterality: N/A;  Lumbar four-five, Redo Lumbar Laminectomy, Repair of CSF Leak  . Lumbar wound debridement 05/20/2011    Procedure: LUMBAR WOUND DEBRIDEMENT;  Surgeon:  Karn Cassis, MD;  Location: [facility];  Service: Neurosurgery;  Laterality: N/A;  Incision and Drainage of Lumbar wound   Family History  Problem Relation Age of Onset  . Hypertension Mother   . Lung cancer Father    History   Social History  . Marital Status: Widowed    Spouse Name: N/A    Number of Children: N/A  . Years of Education: N/A   Occupational History  . Not on file.   Social History Main Topics  . Smoking status: Current Everyday Smoker -- 0.5 packs/day  . Smokeless tobacco: Not on file  . Alcohol Use: No  . Drug Use: No  . Sexually Active: No   Other Topics Concern  . Not on file   Social History Narrative  . No narrative on file   Allergies  Allergen Reactions  . Codeine Hives  . Gabapentin Rash   Objective: Vital signs in last 24 hours: Temp:  [97.9 F (36.6 C)-98.6 F (37 C)] 98.6 F (37 C) (03/27 2005) Pulse Rate:  [85-101] 95  (03/27 2045) Resp:  [8-26] 8  (03/27 2045) BP: (97-150)/(66-93) 150/87 mmHg (03/27 2024) SpO2:  [92 %-100 %] 95 % (03/27 2045) Weight:  [95.391 kg (210 lb 4.8 oz)] 95.391 kg (210 lb 4.8 oz) (03/26 2127)  Intake/Output from previous day: 03/26 0701 - 03/27 0700 In: 240 [P.O.:240] Out: 2025 [Urine:2025] Intake/Output this shift: Total I/O In: 1600 [I.V.:1600] Out: 400 [Blood:400]  Physical exam:  Const: Patient is alert and cooperative in mild distress with pain HEENT: EOMI, PERRL, atraumatic , normocephalic, oropharynx without erythema or exudates CV: RRR, normal s1 s2,without mrg LUNGS: clear to auscultation bilaterally ABD: soft non tender, non distended, normal BS SKIN: lumbar ulcer with redness and swelling, non tender no rashes or edema NEURO: muscle function and sensation intact in UE and LE  Lab Results  Basename 06/15/11 0540 06/14/11 1927 06/14/11 1148  WBC 7.7 8.4 --  HGB 10.5* 12.3 --  HCT 31.9* 36.3 --  NA 135 -- 136  K 3.1* -- 2.9*  CL 100 -- 98  CO2 27 -- 28  BUN 6 -- 5*    CREATININE 0.42* -- 0.36*  GLU -- -- --   Liver Panel No results found for this basename: PROT:2,ALBUMIN:2,AST:2,ALT:2,ALKPHOS:2,BILITOT:2,BILIDIR:2,IBILI:2 in the last 72 hours Sedimentation Rate No results found for this basename: ESRSEDRATE in the last 72 hours C-Reactive Protein  Basename 06/14/11 1148  CRP 2.71*    Microbiology: Recent Results (from the past 240 hour(s))  GRAM STAIN     Status: Normal   Collection Time   06/15/11  7:04 PM      Component Value Range Status Comment   Specimen Description WOUND BACK   Final    Special Requests PATIENT ON FOLLOWING VANCOMYCIN LUMBAR WOUND   Final    Gram Stain     Final    Value: RARE WBC PRESENT, PREDOMINANTLY MONONUCLEAR     NO ORGANISMS SEEN   Report Status 06/15/2011 FINAL   Final   GRAM STAIN     Status: Normal   Collection Time   06/15/11  7:06 PM      Component Value Range Status Comment  Specimen Description TISSUE   Final    Special Requests PATIENT ON FOLLOWING VANCOMYCIN DISC SPACE   Final    Gram Stain     Final    Value: ABUNDANT WBC PRESENT,BOTH PMN AND MONONUCLEAR     NO ORGANISMS SEEN   Report Status 06/15/2011 FINAL   Final     Studies/Results: Mr Lumbar Spine W Wo Contrast  06/14/2011  *RADIOLOGY REPORT*  Clinical Data: History of lung history lumbar surgery.  Staph infection.  Pain.  MRI LUMBAR SPINE WITHOUT AND WITH CONTRAST  Technique:  Multiplanar and multiecho pulse sequences of the lumbar spine were obtained without and with intravenous contrast.  Contrast: 15mL MULTIHANCE GADOBENATE DIMEGLUMINE 529 MG/ML IV SOLN  Comparison: MR lumbar spine 05/08/2011.  Findings: The patient status post right L4-5 laminotomy and discectomy.  There is intense marrow edema and enhancement throughout the L4-L5 vertebral bodies consistent with diskitis and osteomyelitis.  Fluid is present in the disc interspace.  A large phlegmon is seen extending into the central spinal canal measuring 3.4 cm cranial-caudal by 1.9 cm  transverse by 1.1 cm AP.  There is a tiny epidural abscess  seen at the level of mid L4 measuring 0.4 cm in diameter. Phlegmon and epidural abscess cause marked compression of the thecal sac from mid L4 to mid L5.  There is extensive paraspinous inflammatory change with abscess formation in the left paravertebral space identified.  This abscess measures 1.6 cm AP by 0.8 cm transverse by approximately 2.2 cm cranial-caudal.  Also seen is a subcutaneous fluid collection with rim enhancement.  This collection extends from the level of mid L3 to the L4-5 interspace and measures 4.9 cm cranial-caudal by 2.6 cm AP by 1.2 cm transverse.  IMPRESSION: Intense appearing diskitis and osteomyelitis at L4-5 with a large phlegmon extending from the L4 to mid L5 and a small epidural abscess causing marked compression of the thecal sac.  Paraspinous abscess on the left and subcutaneous rim enhancing fluid collection also consistent with abscess is identified. Findings called to Dr. Jeral Fruit at the time of interpretation.  Original Report Authenticated By: Bernadene Bell. Maricela Curet, M.D.    Medications:   Current facility-administered medications:0.9 %  sodium chloride infusion, , Intravenous, Continuous, Karn Cassis, MD, Last Rate: 75 mL/hr at 06/15/11 2200;  0.9 %  sodium chloride infusion, 250 mL, Intravenous, Continuous, Karn Cassis, MD;  0.9 %  sodium chloride infusion, , Intravenous, Continuous, Karn Cassis, MD;  acetaminophen (TYLENOL) suppository 650 mg, 650 mg, Rectal, Q4H PRN, Karn Cassis, MD acetaminophen (TYLENOL) tablet 650 mg, 650 mg, Oral, Q4H PRN, Karn Cassis, MD;  bisacodyl (DULCOLAX) EC tablet 5 mg, 5 mg, Oral, Daily PRN, Karn Cassis, MD;  ceFEPIme (MAXIPIME) 2 g in dextrose 5 % 50 mL IVPB, 2 g, Intravenous, Q12H, Gardiner Barefoot, MD;  diazepam (VALIUM) tablet 10 mg, 10 mg, Oral, Q8H PRN, Karn Cassis, MD;  docusate sodium (COLACE) capsule 100 mg, 100 mg, Oral, BID, Karn Cassis,  MD HYDROmorphone (DILAUDID) 1 MG/ML injection, , , , ;  HYDROmorphone (DILAUDID) 1 MG/ML injection, , , , ;  menthol-cetylpyridinium (CEPACOL) lozenge 3 mg, 1 lozenge, Oral, PRN, Karn Cassis, MD;  morphine 4 MG/ML injection 4 mg, 4 mg, Intravenous, Q4H PRN, Karn Cassis, MD, 4 mg at 06/15/11 2146;  ondansetron [facility]) injection 4 mg, 4 mg, Intravenous, Q4H PRN, Karn Cassis, MD oxyCODONE (Oxy IR/ROXICODONE) immediate release tablet 15 mg, 15 mg, Oral,  Q4H PRN, Karn Cassis, MD, 15 mg at 06/15/11 1659;  phenol (CHLORASEPTIC) mouth spray 1 spray, 1 spray, Mouth/Throat, PRN, Karn Cassis, MD;  risperiDONE (RISPERDAL) tablet 1 mg, 1 mg, Oral, Daily, Cheri Guppy, MD, 1 mg at 06/15/11 1057;  risperiDONE (RISPERDAL) tablet 2 mg, 2 mg, Oral, QHS, Cheri Guppy, MD, 2 mg at 06/14/11 2233 sodium chloride 0.9 % injection 10-40 mL, 10-40 mL, Intracatheter, PRN, Karn Cassis, MD, 10 mL at 06/15/11 0544;  sodium chloride 0.9 % injection 3 mL, 3 mL, Intravenous, Q12H, Karn Cassis, MD;  sodium chloride 0.9 % injection 3 mL, 3 mL, Intravenous, PRN, Karn Cassis, MD;  sodium phosphate (FLEET) 7-19 GM/118ML enema 1 enema, 1 enema, Rectal, Once PRN, Karn Cassis, MD tiotropium [facility]) inhalation capsule 18 mcg, 18 mcg, Inhalation, Daily, Karn Cassis, MD, 18 mcg at 06/15/11 0827;  vancomycin (VANCOCIN) IVPB 1000 mg/200 mL premix, 1,000 mg, Intravenous, Q8H, Herby Abraham, PHARMD, 1,000 mg at 06/15/11 1407;  zolpidem (AMBIEN) tablet 10 mg, 10 mg, Oral, QHS PRN, Karn Cassis, MD;  DISCONTD: 0.9 %  sodium chloride infusion, 250 mL, Intravenous, Continuous, Karn Cassis, MD DISCONTD: 0.9 %  sodium chloride infusion, , Intravenous, Continuous, Karn Cassis, MD;  DISCONTD: 0.9 % irrigation (POUR BTL), , , PRN, Karn Cassis, MD, 1,000 mL at 06/15/11 1818;  DISCONTD: amLODipine (NORVASC) tablet 10 mg, 10 mg, Oral, Daily, Karn Cassis, MD, 10 mg at 06/15/11 1057;   DISCONTD: diazepam (VALIUM) tablet 10 mg, 10 mg, Oral, Q8H PRN, Karn Cassis, MD DISCONTD: hemostatic agents, , , PRN, Karn Cassis, MD, 1 application at 06/15/11 1821;  DISCONTD: HYDROmorphone (DILAUDID) injection 0.25-0.5 mg, 0.25-0.5 mg, Intravenous, Q5 min PRN, Hart Robinsons, MD, 0.5 mg at 06/15/11 2037;  DISCONTD: menthol-cetylpyridinium (CEPACOL) lozenge 3 mg, 1 lozenge, Oral, PRN, Karn Cassis, MD;  DISCONTD: metoCLOPramide (REGLAN) injection 10 mg, 10 mg, Intravenous, Once PRN, Hart Robinsons, MD DISCONTD: morphine 4 MG/ML injection 4 mg, 4 mg, Intravenous, Q4H PRN, Karn Cassis, MD, 4 mg at 06/15/11 1505;  DISCONTD: morphine 4 MG/ML injection 4 mg, 4 mg, Intravenous, Q4H PRN, Karn Cassis, MD;  DISCONTD: ondansetron (ZOFRAN) injection 4 mg, 4 mg, Intravenous, Q4H PRN, Karn Cassis, MD;  DISCONTD: phenol (CHLORASEPTIC) mouth spray 1 spray, 1 spray, Mouth/Throat, PRN, Karn Cassis, MD DISCONTD: sodium chloride 0.9 % injection 3 mL, 3 mL, Intravenous, Q12H, Karn Cassis, MD;  DISCONTD: sodium chloride 0.9 % injection 3 mL, 3 mL, Intravenous, PRN, Karn Cassis, MD;  DISCONTD: Surgifoam 1 Gm with Thrombin 5,000 units (5 ml) topical solution, , , PRN, Karn Cassis, MD;  DISCONTD: thrombin kit 5000 units, , , PRN, Karn Cassis, MD, 5,000 Units at 06/15/11 1930 DISCONTD: zolpidem (AMBIEN) tablet 10 mg, 10 mg, Oral, QHS PRN, Karn Cassis, MD, 10 mg at 06/14/11 2233;  DISCONTD: zolpidem (AMBIEN) tablet 10 mg, 10 mg, Oral, QHS PRN, Karn Cassis, MD Facility-Administered Medications Ordered in Other Encounters: DISCONTD: fentaNYL (SUBLIMAZE) injection, , , PRN, Ervin Knack, CRNA, 50 mcg at 06/15/11 1903;  DISCONTD: glycopyrrolate (ROBINUL) injection, , , PRN, Ervin Knack, CRNA, 0.8 mg at 06/15/11 1927;  DISCONTD: lactated ringers infusion, , , Continuous PRN, Ervin Knack, CRNA;  DISCONTD: metoCLOPramide (REGLAN) injection, , , PRN, Ervin Knack,  CRNA, 10 mg at 06/15/11 1832 DISCONTD: neostigmine (PROSTIGMINE) injection, , Intravenous, PRN, Ervin Knack, CRNA, 5 mg at  06/15/11 1927;  DISCONTD: ondansetron (ZOFRAN) injection, , , PRN, Ervin Knack, CRNA, 4 mg at 06/15/11 1832;  DISCONTD: propofol (DIPRIVAN) 10 MG/ML infusion, , , PRN, Ervin Knack, CRNA, 20 mg at 06/15/11 1903;  DISCONTD: rocuronium (ZEMURON) injection, , , PRN, Ervin Knack, CRNA, 30 mg at 06/15/11 1820  Assessment/Plan:  Assessment: this patient has discitis, osteomyelitis and epidural abscess, S/p drainage by Dr Jeral Fruit. Will need to cover broadly with antibiotics given the patient's recurrent infection and spread to internal structures.  Plan: Continue vancomycin and add cefepime for gram positive and gram negative coverage including Pseudomonas. Plan to continue therapy for at least 8 weeks. Will follow abscess cultures and patient's progress.   Thanks for this consult.   LOS: 1 day    Staci Righter, MD

## 2011-06-15 NOTE — Anesthesia Preprocedure Evaluation (Addendum)
Anesthesia Evaluation  Patient identified by MRN, date of birth, ID band Patient awake    Reviewed: Allergy & Precautions, H&P , NPO status , Patient's Chart, lab work & pertinent test results, reviewed documented beta blocker date and time   Airway Mallampati: II TM Distance: >3 FB Neck ROM: full    Dental   Pulmonary shortness of breath and with exertion, asthma , COPD         Cardiovascular hypertension, negative cardio ROS      Neuro/Psych  Headaches, PSYCHIATRIC DISORDERS  Neuromuscular disease    GI/Hepatic negative GI ROS, Neg liver ROS,   Endo/Other  Diabetes mellitus-Morbid obesity  Renal/GU negative Renal ROS  negative genitourinary   Musculoskeletal   Abdominal   Peds  Hematology negative hematology ROS (+)   Anesthesia Other Findings See surgeon's H&P   Reproductive/Obstetrics negative OB ROS                           Anesthesia Physical Anesthesia Plan  ASA: III and Emergent  Anesthesia Plan: General   Post-op Pain Management:    Induction: Intravenous, Rapid sequence and Cricoid pressure planned  Airway Management Planned: Oral ETT  Additional Equipment:   Intra-op Plan:   Post-operative Plan: Extubation in OR  Informed Consent: I have reviewed the patients History and Physical, chart, labs and discussed the procedure including the risks, benefits and alternatives for the proposed anesthesia with the patient or authorized representative who has indicated his/her understanding and acceptance.     Plan Discussed with: CRNA and Surgeon  Anesthesia Plan Comments:         Anesthesia Quick Evaluation

## 2011-06-15 NOTE — Anesthesia Postprocedure Evaluation (Signed)
Anesthesia Post Note  Patient: Julia [name]  Procedure(s) Performed: Procedure(s) (LRB): LUMBAR WOUND DEBRIDEMENT (N/A)  Anesthesia type: general  Patient location: PACU  Post pain: Pain level controlled  Post assessment: Patient's Cardiovascular Status Stable  Last Vitals:  Filed Vitals:   06/15/11 2005  BP: 142/93  Pulse: 85  Temp: 37 C  Resp: 26    Post vital signs: Reviewed and stable  Level of consciousness: sedated  Complications: No apparent anesthesia complications

## 2011-06-15 NOTE — Transfer of Care (Signed)
Immediate Anesthesia Transfer of Care Note  Patient: Julia [name]  Procedure(s) Performed: Procedure(s) (LRB): LUMBAR WOUND DEBRIDEMENT (N/A)  Patient Location: PACU  Anesthesia Type: General  Level of Consciousness: awake, alert  and patient cooperative  Airway & Oxygen Therapy: Patient Spontanous Breathing and Patient connected to face mask oxygen  Post-op Assessment: Report given to PACU RN  Post vital signs: Reviewed and stable  Complications: No apparent anesthesia complications

## 2011-06-15 NOTE — Progress Notes (Signed)
Attempted to call report to [phone].  RN unable to take report at this time due to busy with another patient.  RN to return my call.

## 2011-06-15 NOTE — Consult Note (Signed)
Consult Admission Note Date: 06/15/2011  PCP: Marin Comment, FNP, FNP  Chief Complaint:management of Diabetes and HTN  History of Present Illness: This is a 46 year old female who underwent discectomy in twice this year. Readmitted because of lumbar wound infection she was explored and started on IV antibiotics per ID developed increased wound drainage and was admitted to the hospital and discharge on 05/25/2011 for increased wound drainage. Was discharged home doing quite well but then she started developing back pain and was unable to walk. She relates she had no fever. So she contacted Dr. Cassandria Santee and told her to proceed to the ED. She related in the ED that her pain had progressively gotten worse over the past 5 days but was worse during the last 2 days, she was unable to walk denies any fever chills malaise or urinary symptoms. She relates no loss of bowel or urinary retention or incontinence. No leg numbness. Allergies: Codeine and Gabapentin Past Medical History  Diagnosis Date  . Hypertension   . Diabetes mellitus   . COPD (chronic obstructive pulmonary disease)   . Asthma   . Shortness of breath   . Headache   . Arthritis   . Bipolar affective disorder   . Fibromyalgia   . Cirrhosis of liver   . Anxiety   . Diabetes mellitus 05/20/2011  . HTN (hypertension) 05/20/2011  . S/P diskectomy 05/20/2011  . Cirrhosis of liver   . Bipolar affective disorder   . Bipolar 1 disorder    Prior to Admission medications   Medication Sig Start Date End Date Taking? Authorizing Provider  amLODipine (NORVASC) 10 MG tablet Take 10 mg by mouth daily.   Yes Historical Provider, MD  carisoprodol (SOMA) 350 MG tablet Take 350 mg by mouth 2 (two) times daily as needed. For pain.   Yes Historical Provider, MD  ibuprofen (ADVIL,MOTRIN) 200 MG tablet Take 200 mg by mouth every 6 (six) hours as needed. For pain.   Yes Historical Provider, MD  insulin NPH-insulin regular (NOVOLIN 70/30) (70-30) 100  UNIT/ML injection Inject 30-50 Units into the skin 2 (two) times daily with a meal. 50 units in the morning; 30 units in the evening   Yes Historical Provider, MD  lisinopril-hydrochlorothiazide (PRINZIDE,ZESTORETIC) 10-12.5 MG per tablet Take 1 tablet by mouth daily.   Yes Historical Provider, MD  oxyCODONE (ROXICODONE) 15 MG immediate release tablet Take 15 mg by mouth every 4 (four) hours as needed. For pain   Yes Historical Provider, MD  risperiDONE (RISPERDAL) 1 MG tablet Take 1-2 mg by mouth 2 (two) times daily. 1mg  in the morning; 2mg  in the evening   Yes Historical Provider, MD  tiotropium (SPIRIVA) 18 MCG inhalation capsule Place 18 mcg into inhaler and inhale daily.   Yes Historical Provider, MD   Past Surgical History  Procedure Date  . Neck surgery   . Cesarean section   . Tonsillectomy   . Back surgery     x2  . Knee arthroscopy     L  . Lumbar laminectomy/decompression microdiscectomy 04/20/2011    Procedure: LUMBAR LAMINECTOMY/DECOMPRESSION MICRODISCECTOMY;  Surgeon: Karn Cassis, MD;  Location: [facility];  Service: Neurosurgery;  Laterality: Right;  Right Lumbar four- five Diskectomy  . Lumbar laminectomy/decompression microdiscectomy 05/08/2011    Procedure: LUMBAR LAMINECTOMY/DECOMPRESSION MICRODISCECTOMY 1 LEVEL;  Surgeon: Clydene Fake, MD;  Location: [facility];  Service: Neurosurgery;  Laterality: N/A;  Lumbar four-five, Redo Lumbar Laminectomy, Repair of CSF Leak  . Lumbar wound debridement  05/20/2011    Procedure: LUMBAR WOUND DEBRIDEMENT;  Surgeon: Karn Cassis, MD;  Location: [facility];  Service: Neurosurgery;  Laterality: N/A;  Incision and Drainage of Lumbar wound   Family History  Problem Relation Age of Onset  . Hypertension Mother   . Lung cancer Father    History   Social History  . Marital Status: Widowed    Spouse Name: N/A    Number of Children: N/A  . Years of Education: N/A   Occupational History  . Not on file.   Social History  Main Topics  . Smoking status: Current Everyday Smoker -- 0.5 packs/day  . Smokeless tobacco: Not on file  . Alcohol Use: No  . Drug Use: No  . Sexually Active: No   Other Topics Concern  . Not on file   Social History Narrative  . No narrative on file    REVIEW OF SYSTEMS:  Constitutional:  No weight loss, night sweats, Fevers, chills, fatigue.  HEENT:  No headaches, Difficulty swallowing,Tooth/dental problems,Sore throat,  No sneezing, itching, ear ache, nasal congestion, post nasal drip,  Cardio-vascular:  No chest pain, Orthopnea, PND, swelling in lower extremities, anasarca, dizziness, palpitations  GI:  No heartburn, indigestion, abdominal pain, nausea, vomiting, diarrhea, change in bowel habits, loss of appetite  Resp:  No shortness of breath with exertion or at rest. No excess mucus, no productive cough, No non-productive cough, No coughing up of blood.No change in color of mucus.No wheezing.No chest wall deformity  Skin:  no rash or lesions.  GU:  no dysuria, change in color of urine, no urgency or frequency. No flank pain.  Musculoskeletal:  No joint pain or swelling. No decreased range of motion. No back pain.  Psych:  No change in mood or affect. No depression or anxiety. No memory loss.   Physical Exam: Filed Vitals:   06/15/11 0521 06/15/11 0828 06/15/11 1028 06/15/11 1359  BP: 111/70  97/66 114/78  Pulse: 94  87 98  Temp: 98.2 F (36.8 C)  98.6 F (37 C) 98.4 F (36.9 C)  TempSrc: Oral  Oral Oral  Resp: 20  20 20   Height:      Weight:      SpO2: 94% 92% 97% 97%    Intake/Output Summary (Last 24 hours) at 06/15/11 1526 Last data filed at 06/15/11 1300  Gross per 24 hour  Intake    240 ml  Output   2025 ml  Net  -1785 ml   BP 114/78  Pulse 98  Temp(Src) 98.4 F (36.9 C) (Oral)  Resp 20  Ht 5\' 5"  (1.651 m)  Wt 95.391 kg (210 lb 4.8 oz)  BMI 35.00 kg/m2  SpO2 97%  LMP 05/09/2011  General Appearance:    Alert, cooperative, no distress,  appears stated age  Head:    Normocephalic, without obvious abnormality, atraumatic  Eyes:    PERRL, conjunctiva/corneas clear, EOM's intact, fundi    benign, both eyes  Ears:    Normal TM's and external ear canals, both ears  Nose:   Nares normal, septum midline, mucosa normal, no drainage    or sinus tenderness  Throat:   Lips, mucosa, and tongue normal; teeth and gums normal  Neck:   Supple, symmetrical, trachea midline, no adenopathy;    thyroid:  no enlargement/tenderness/nodules; no carotid   bruit or JVD  Back:     Symmetric, no curvature, ROM normal, no CVA tenderness  Lungs:     Clear to auscultation  bilaterally, respirations unlabored  Chest Wall:    No tenderness or deformity   Heart:    Regular rate and rhythm, S1 and S2 normal, no murmur, rub   or gallop     Abdomen:     Soft, non-tender, bowel sounds active all four quadrants,    no masses, no organomegaly        Extremities:   Extremities normal, atraumatic, no cyanosis or edema  Pulses:   2+ and symmetric all extremities  Skin:   there is a healing incision in the lumbar spine there is some swelling redness and drainage is nontender to palpation   Lymph nodes:   Cervical, supraclavicular, and axillary nodes normal  Neurologic:   cranial nerves 3-12 are grossly intact muscle strength is 5 over 5 and equal in her lower extremities there's good sensations bilateral in her thighs. She has straight leg raise positive bilaterally but seems more on the left than on the right. Her upper sternum and these were her grossly normal.   Lab results:  Basename 06/15/11 0540 06/14/11 1148  NA 135 136  K 3.1* 2.9*  CL 100 98  CO2 27 28  GLUCOSE 123* 137*  BUN 6 5*  CREATININE 0.42* 0.36*  CALCIUM 9.2 10.1  MG -- --  PHOS -- --   No results found for this basename: AST:2,ALT:2,ALKPHOS:2,BILITOT:2,PROT:2,ALBUMIN:2 in the last 72 hours No results found for this basename: LIPASE:2,AMYLASE:2 in the last 72 hours  Basename 06/15/11  0540 06/14/11 1927  WBC 7.7 8.4  NEUTROABS -- 4.6  HGB 10.5* 12.3  HCT 31.9* 36.3  MCV 90.4 89.2  PLT 136* 142*   No results found for this basename: CKTOTAL:3,CKMB:3,CKMBINDEX:3,TROPONINI:3 in the last 72 hours No components found with this basename: POCBNP:3 No results found for this basename: DDIMER:2 in the last 72 hours No results found for this basename: HGBA1C:2 in the last 72 hours No results found for this basename: CHOL:2,HDL:2,LDLCALC:2,TRIG:2,CHOLHDL:2,LDLDIRECT:2 in the last 72 hours No results found for this basename: TSH,T4TOTAL,FREET3,T3FREE,THYROIDAB in the last 72 hours No results found for this basename: VITAMINB12:2,FOLATE:2,FERRITIN:2,TIBC:2,IRON:2,RETICCTPCT:2 in the last 72 hours Imaging results:  Dg Chest 2 View  05/23/2011  *RADIOLOGY REPORT*  Clinical Data: Cough.  Fever.  Weakness.  CHEST - 2 VIEW  Comparison: 05/20/2011  Findings: Right arm PICC has its tip in the SVC 1 cm above the right atrium.  The lungs are clear.  Vascularity is normal.  No effusions.  No bony abnormalities.  IMPRESSION: No active cardiopulmonary disease.  Right arm PICC well positioned.  Original Report Authenticated By: Thomasenia Sales, M.D.   Mr Lumbar Spine W Wo Contrast  06/14/2011  *RADIOLOGY REPORT*  Clinical Data: History of lung history lumbar surgery.  Staph infection.  Pain.  MRI LUMBAR SPINE WITHOUT AND WITH CONTRAST  Technique:  Multiplanar and multiecho pulse sequences of the lumbar spine were obtained without and with intravenous contrast.  Contrast: 15mL MULTIHANCE GADOBENATE DIMEGLUMINE 529 MG/ML IV SOLN  Comparison: MR lumbar spine 05/08/2011.  Findings: The patient status post right L4-5 laminotomy and discectomy.  There is intense marrow edema and enhancement throughout the L4-L5 vertebral bodies consistent with diskitis and osteomyelitis.  Fluid is present in the disc interspace.  A large phlegmon is seen extending into the central spinal canal measuring 3.4 cm cranial-caudal by  1.9 cm transverse by 1.1 cm AP.  There is a tiny epidural abscess  seen at the level of mid L4 measuring 0.4 cm in diameter. Phlegmon and epidural  abscess cause marked compression of the thecal sac from mid L4 to mid L5.  There is extensive paraspinous inflammatory change with abscess formation in the left paravertebral space identified.  This abscess measures 1.6 cm AP by 0.8 cm transverse by approximately 2.2 cm cranial-caudal.  Also seen is a subcutaneous fluid collection with rim enhancement.  This collection extends from the level of mid L3 to the L4-5 interspace and measures 4.9 cm cranial-caudal by 2.6 cm AP by 1.2 cm transverse.  IMPRESSION: Intense appearing diskitis and osteomyelitis at L4-5 with a large phlegmon extending from the L4 to mid L5 and a small epidural abscess causing marked compression of the thecal sac.  Paraspinous abscess on the left and subcutaneous rim enhancing fluid collection also consistent with abscess is identified. Findings called to Dr. Jeral Fruit at the time of interpretation.  Original Report Authenticated By: Bernadene Bell. Maricela Curet, M.D.   Dg Chest Port 1 View  05/20/2011  *RADIOLOGY REPORT*  Clinical Data: Cough.  PORTABLE CHEST - 1 VIEW  Comparison: Chest radiograph performed 04/20/2011  Findings: The lungs are well-aerated and clear.  There is no evidence of focal opacification, pleural effusion or pneumothorax. Pulmonary vascularity is at the upper limits of normal.  The lung bases are incompletely imaged on this study.  The heart is normal in size; the mediastinal contour is within normal limits.  No acute osseous abnormalities are seen.  IMPRESSION: No acute cardiopulmonary process seen.  Original Report Authenticated By: Tonia Ghent, M.D.   Other results: EKG: None.   Patient Active Hospital Problem List: Diabetes mellitus (05/20/2011)  her diabetes seems well controlled. She is currently n.p.o. the highest blood glucose she's had in the hospital and 137. I will  check a hemoglobin A1c. We'll stop her 70/30. The patient relates she has never been tried on oral hypoglycemic agents. Once she starts her diet. We'll start her on Lantus plus sliding scale. We'll also give her a trial for home on metformin to see if she tolerates this as an outpatient, it will also depend on her hemoglobin A1c.   Increased wound drainage (05/20/2011)  probable discitis, per neurosurgery. ID has been consulted for further antibiotic management.  COPD (chronic obstructive pulmonary disease) (05/20/2011)  stable continue current meds.  HTN (hypertension) (05/20/2011)   her blood pressure is actually well controlled here in the hospital. At this time we'll put her n.p.o. If  blood pressure medications needs to be started after surgery would recommend restarting Norvasc. We'll have to be sure that she is not in any pain as this can increase her blood pressure.   Bipolar affective disorder (05/20/2011)  stable continue current meds.   Code Status:  Full code Family Communication: Mother [phone]  Marinda Elk M.D. [facility] [phone] 06/15/2011, 3:26 PM

## 2011-06-16 ENCOUNTER — Encounter (HOSPITAL_COMMUNITY): Payer: Self-pay | Admitting: Neurosurgery

## 2011-06-16 DIAGNOSIS — T8140XA Infection following a procedure, unspecified, initial encounter: Secondary | ICD-10-CM

## 2011-06-16 DIAGNOSIS — Y849 Medical procedure, unspecified as the cause of abnormal reaction of the patient, or of later complication, without mention of misadventure at the time of the procedure: Secondary | ICD-10-CM

## 2011-06-16 LAB — GLUCOSE, CAPILLARY
Glucose-Capillary: 81 mg/dL (ref 70–99)
Glucose-Capillary: 143 mg/dL — ABNORMAL HIGH (ref 70–99)

## 2011-06-16 LAB — HEMOGLOBIN A1C: Mean Plasma Glucose: 120 mg/dL — ABNORMAL HIGH (ref <117)

## 2011-06-16 MED ORDER — POTASSIUM CHLORIDE CRYS ER 20 MEQ PO TBCR
20.0000 meq | EXTENDED_RELEASE_TABLET | Freq: Two times a day (BID) | ORAL | Status: DC
  Administered 2011-06-16 – 2011-06-27 (×23): 20 meq via ORAL
  Filled 2011-06-16 (×25): qty 1

## 2011-06-16 MED ORDER — INSULIN ASPART 100 UNIT/ML ~~LOC~~ SOLN
0.0000 [IU] | Freq: Three times a day (TID) | SUBCUTANEOUS | Status: DC
  Administered 2011-06-16 – 2011-06-17 (×2): 2 [IU] via SUBCUTANEOUS

## 2011-06-16 MED ORDER — WHITE PETROLATUM GEL
Status: AC
  Filled 2011-06-16: qty 5

## 2011-06-16 MED ORDER — INSULIN ASPART 100 UNIT/ML ~~LOC~~ SOLN: 0.0000 [IU] | Freq: Every day | SUBCUTANEOUS | Status: DC

## 2011-06-16 NOTE — Evaluation (Signed)
Physical Therapy Evaluation Patient Details Name: Julia [name] MRN: [id number] DOB: 11/14/1965 Today's Date: 06/16/2011  Problem List:  Patient Active Problem List  Diagnoses  . Diabetes mellitus  . Cough  . Increased wound drainage  . COPD (chronic obstructive pulmonary disease)  . HTN (hypertension)  . Bipolar affective disorder  . S/P diskectomy    Past Medical History:  Past Medical History  Diagnosis Date  . Hypertension   . Diabetes mellitus   . COPD (chronic obstructive pulmonary disease)   . Asthma   . Shortness of breath   . Headache   . Arthritis   . Bipolar affective disorder   . Fibromyalgia   . Cirrhosis of liver   . Anxiety   . Diabetes mellitus 05/20/2011  . HTN (hypertension) 05/20/2011  . S/P diskectomy 05/20/2011  . Cirrhosis of liver   . Bipolar affective disorder   . Bipolar 1 disorder    Past Surgical History:  Past Surgical History  Procedure Date  . Neck surgery   . Cesarean section   . Tonsillectomy   . Back surgery     x2  . Knee arthroscopy     L  . Lumbar laminectomy/decompression microdiscectomy 04/20/2011    Procedure: LUMBAR LAMINECTOMY/DECOMPRESSION MICRODISCECTOMY;  Surgeon: Karn Cassis, MD;  Location: [facility];  Service: Neurosurgery;  Laterality: Right;  Right Lumbar four- five Diskectomy  . Lumbar laminectomy/decompression microdiscectomy 05/08/2011    Procedure: LUMBAR LAMINECTOMY/DECOMPRESSION MICRODISCECTOMY 1 LEVEL;  Surgeon: Clydene Fake, MD;  Location: [facility];  Service: Neurosurgery;  Laterality: N/A;  Lumbar four-five, Redo Lumbar Laminectomy, Repair of CSF Leak  . Lumbar wound debridement 05/20/2011    Procedure: LUMBAR WOUND DEBRIDEMENT;  Surgeon: Karn Cassis, MD;  Location: [facility];  Service: Neurosurgery;  Laterality: N/A;  Incision and Drainage of Lumbar wound  . Lumbar wound debridement 06/15/2011    Procedure: LUMBAR WOUND DEBRIDEMENT;  Surgeon: Karn Cassis, MD;  Location: [facility];   Service: Neurosurgery;  Laterality: N/A;  Exploration of Lumbar wound    PT Assessment/Plan/Recommendation PT Assessment Clinical Impression Statement: Pt is a 46 y/o female with two recent admissions for lumbar dissectomies.  Pt now admitted for spinal infection of unknown origin with subsequent L4 and L5 bilateral laminectomies.  Pt presents with decreased mobility, impaired balance, pain, and decreased knownledge of precautions.  Pt will benefit from skilled physical therapy in the acute setting to improve overall mobility and maximize independence.  Pt will benefit from HHPT to allow for further independence and increased safety in home environment. PT Recommendation/Assessment: Patient will need skilled PT in the acute care venue PT Problem List: Decreased activity tolerance;Decreased balance;Decreased mobility;Decreased knowledge of use of DME;Decreased knowledge of precautions;Pain PT Therapy Diagnosis : Difficulty walking;Abnormality of gait;Acute pain PT Plan PT Frequency: Min 5X/week PT Treatment/Interventions: DME instruction;Gait training;Stair training;Functional mobility training;Therapeutic activities;Balance training;Patient/family education PT Recommendation Follow Up Recommendations: Home health PT;Supervision/Assistance - 24 hour Equipment Recommended: None recommended by PT PT Goals  Acute Rehab PT Goals PT Goal Formulation: With patient Time For Goal Achievement: 7 days Pt will Roll Supine to Right Side: Independently PT Goal: Rolling Supine to Right Side - Progress: Goal set today Pt will go Supine/Side to Sit: Independently PT Goal: Supine/Side to Sit - Progress: Goal set today Pt will go Sit to Stand: with modified independence PT Goal: Sit to Stand - Progress: Goal set today Pt will go Stand to Sit: with modified independence PT Goal:  Stand to Sit - Progress: Goal set today Pt will Ambulate: >150 feet;with least restrictive assistive device;with supervision PT  Goal: Ambulate - Progress: Goal set today Pt will Go Up / Down Stairs: 3-5 stairs;with least restrictive assistive device;with supervision PT Goal: Up/Down Stairs - Progress: Goal set today Additional Goals Additional Goal #1: Pt will verbalize and demosntrate 3/3 back precautions throughout treatment session. PT Goal: Additional Goal #1 - Progress: Goal set today  PT Evaluation Precautions/Restrictions  Precautions Precautions: Back;Fall Precaution Booklet Issued: No Precaution Comments: pt educated on back precautions . Pt recalling (twisting) precaution from previous hospitalizations Required Braces or Orthoses: No Restrictions Weight Bearing Restrictions: No Prior Functioning  Home Living Lives With:  (going home with mother) Dolores Lory Help From: Family Type of Home: House Home Layout: One level Home Access: Stairs to enter Entrance Stairs-Rails: None Entrance Stairs-Number of Steps: 3 Bathroom Shower/Tub: Tub/shower unit;Walk-in shower Bathroom Toilet: Standard Bathroom Accessibility: Yes How Accessible: Accessible via walker Home Adaptive Equipment: Straight cane;Bedside commode/3-in-1;Walker - rolling;Shower chair with back;Reacher;Long-handled sponge Additional Comments: ` Prior Function Level of Independence: Needs assistance with ADLs;Requires assistive device for independence Dressing: Moderate Driving: No Vocation: Unemployed Comments: states that her mother has been assisting with LB adls Cognition Cognition Arousal/Alertness: Lethargic Overall Cognitive Status: Appears within functional limits for tasks assessed Orientation Level: Oriented X4 Sensation/Coordination Sensation Light Touch: Appears Intact Coordination Gross Motor Movements are Fluid and Coordinated: Yes Fine Motor Movements are Fluid and Coordinated: Yes Extremity Assessment RUE Assessment RUE Assessment: Within Functional Limits (grossly) LUE Assessment LUE Assessment: Within Functional  Limits (grossly) Mobility (including Balance) Bed Mobility Bed Mobility: Yes Rolling Right: 4: Min assist;With rail Rolling Right Details (indicate cue type and reason): Pt required min assist to initiate transition and VC for sequencing to maintain back precautions. Right Sidelying to Sit: 3: Mod assist;With rails;HOB elevated (comment degrees) Right Sidelying to Sit Details (indicate cue type and reason): Pt required mod assist to transition trunk to sitting sitting position and VC to maintain back precautions and sequencing. Sitting - Scoot to Edge of Bed: 5: Supervision;With rail Sitting - Scoot to Edge of Bed Details (indicate cue type and reason): Pt requires supervision for safety Transfers Transfers: Yes Sit to Stand: 3: Mod assist;With upper extremity assist;From bed Sit to Stand Details (indicate cue type and reason): Pt required mod assist in order to initiate transfer to standing. Stand to Sit: 3: Mod assist;With upper extremity assist;To chair/3-in-1;With armrests Stand to Sit Details: Pt required mod assist to control descent and VC regarding sequencing and hand placement with RW.  However, patient did not follow commands regarding hand placement. Ambulation/Gait Ambulation/Gait: Yes Ambulation/Gait Assistance: 3: Mod assist Ambulation/Gait Assistance Details (indicate cue type and reason): Pt required mod assist for safety due to decreased balance and pain. Ambulation Distance (Feet): 10 Feet Assistive device: Rolling walker Gait Pattern: Step-to pattern;Decreased stride length;Trunk flexed Gait velocity: Decreased Stairs: No Wheelchair Mobility Wheelchair Mobility: No  Posture/Postural Control Posture/Postural Control: No significant limitations Balance Balance Assessed: Yes Static Sitting Balance Static Sitting - Balance Support: Feet supported;No upper extremity supported Static Sitting - Level of Assistance: 4: Min assist Static Sitting - Comment/# of Minutes: Pt  required min assist for safety. 5 minutes. Exercise    End of Session PT - End of Session Equipment Utilized During Treatment: Gait belt Activity Tolerance: Patient limited by pain Patient left: in chair;with call bell in reach Nurse Communication: Mobility status for transfers;Mobility status for ambulation;Other (comment) (Nurse told pt should sit  in chair for atleast 15 minutes.) General Behavior During Session: [facility] for tasks performed Cognition: [facility] for tasks performed  Ezzard Standing SPT 06/16/2011, 2:16 PM  [place], PT DPT [phone]

## 2011-06-16 NOTE — Progress Notes (Signed)
Utilization review completed. Rogerio Boutelle, RN, BSN. 06/16/11  

## 2011-06-16 NOTE — Plan of Care (Signed)
Problem: Phase II Progression Outcomes Goal: Progress activity as tolerated unless otherwise ordered Outcome: Progressing Pt tolerated OOB for 15 minutes today with OT / PT eval.

## 2011-06-16 NOTE — Evaluation (Addendum)
Occupational Therapy Evaluation Patient Details Name: Julia [name] MRN: [id number] DOB: 08/09/1965 Today's Date: 06/16/2011  Problem List:  Patient Active Problem List  Diagnoses  . Diabetes mellitus  . Cough  . Increased wound drainage  . COPD (chronic obstructive pulmonary disease)  . HTN (hypertension)  . Bipolar affective disorder  . S/P diskectomy    Past Medical History:  Past Medical History  Diagnosis Date  . Hypertension   . Diabetes mellitus   . COPD (chronic obstructive pulmonary disease)   . Asthma   . Shortness of breath   . Headache   . Arthritis   . Bipolar affective disorder   . Fibromyalgia   . Cirrhosis of liver   . Anxiety   . Diabetes mellitus 05/20/2011  . HTN (hypertension) 05/20/2011  . S/P diskectomy 05/20/2011  . Cirrhosis of liver   . Bipolar affective disorder   . Bipolar 1 disorder    Past Surgical History:  Past Surgical History  Procedure Date  . Neck surgery   . Cesarean section   . Tonsillectomy   . Back surgery     x2  . Knee arthroscopy     L  . Lumbar laminectomy/decompression microdiscectomy 04/20/2011    Procedure: LUMBAR LAMINECTOMY/DECOMPRESSION MICRODISCECTOMY;  Surgeon: Karn Cassis, MD;  Location: [facility];  Service: Neurosurgery;  Laterality: Right;  Right Lumbar four- five Diskectomy  . Lumbar laminectomy/decompression microdiscectomy 05/08/2011    Procedure: LUMBAR LAMINECTOMY/DECOMPRESSION MICRODISCECTOMY 1 LEVEL;  Surgeon: Clydene Fake, MD;  Location: [facility];  Service: Neurosurgery;  Laterality: N/A;  Lumbar four-five, Redo Lumbar Laminectomy, Repair of CSF Leak  . Lumbar wound debridement 05/20/2011    Procedure: LUMBAR WOUND DEBRIDEMENT;  Surgeon: Karn Cassis, MD;  Location: [facility];  Service: Neurosurgery;  Laterality: N/A;  Incision and Drainage of Lumbar wound    OT Assessment/Plan/Recommendation OT Assessment Clinical Impression Statement: 46 yo female s/p underwent two diskectomies at the  level of L4/L5 by Dr Jeral Fruit on January 30 and by Dr Phoebe Perch on February 17. Cultures at that time failed to grow any particular organism. The surgical wound failed to heal completely and drained copious fluid. A fluid sample was sent for culture by Dr Jeral Fruit on February 26 who was following up the patient in the clinic and grew Enterococcus, resistant to ancef, and cefoxitin and sensitive to ceftriaxone, ceftaz, zosyn, imipenem, cipro. TMP/SMX, tobra and gentamicin.Ciproflocaxin was prescribed which however did not relieve the wound discharge and the patient was admitted on 03/01 for wound irrigation and debridement. Wound cultures did not reveal the organism after ciprofloxacin and perioperative cefazolin. ID was consulted and Dr Daiva Eves suggested that the patient should receive a 4 week course of IV ceftriaxone. The patient was followed up in the ID clinic and seemed to be improving but she finally presented again on 03/26 with fever and back pain. A lumbar MRI revealed a large phlegmon at the L4/L5 level consistent with osteomyelitis and epidural and paraspinal abscesses. The patient was empirically switched to vancomycin and underwent a repeat I and D by Dr Jeral Fruit OT Recommendation/Assessment: Patient will need skilled OT in the acute care venue OT Problem List: Decreased strength;Decreased activity tolerance;Impaired balance (sitting and/or standing);Decreased safety awareness;Decreased knowledge of use of DME or AE;Pain OT Therapy Diagnosis : Acute pain;Generalized weakness OT Plan OT Frequency: Min 2X/week OT Treatment/Interventions: Self-care/ADL training;DME and/or AE instruction;Therapeutic activities;Patient/family education OT Recommendation Follow Up Recommendations: No OT follow up Equipment Recommended: None recommended  by OT Individuals Consulted Consulted and Agree with Results and Recommendations: Patient OT Goals Acute Rehab OT Goals OT Goal Formulation: With patient Time For Goal  Achievement: 2 weeks ADL Goals Pt Will Perform Lower Body Bathing: with modified independence;Sit to stand from chair;Sit to stand from bed ADL Goal: Lower Body Bathing - Progress: Goal set today Pt Will Perform Lower Body Dressing: with modified independence;Sit to stand from chair;Sit to stand from bed;with adaptive equipment ADL Goal: Lower Body Dressing - Progress: Goal set today Pt Will Transfer to Toilet: with modified independence;Regular height toilet ADL Goal: Toilet Transfer - Progress: Goal set today Pt Will Perform Toileting - Clothing Manipulation: with modified independence;Sitting on 3-in-1 or toilet ADL Goal: Toileting - Clothing Manipulation - Progress: Goal set today Pt Will Perform Toileting - Hygiene: with modified independence;Sit to stand from 3-in-1/toilet ADL Goal: Toileting - Hygiene - Progress: Goal set today Miscellaneous OT Goals Miscellaneous OT Goal #1: Pt will verbalized 3 out 3 back precautions as precursor to ADLS OT Goal: Miscellaneous Goal #1 - Progress: Goal set today  OT Evaluation Precautions/Restrictions  Precautions Precautions: Back;Fall Precaution Booklet Issued: No Precaution Comments: pt educated on back precautions . Pt recalling (twisting) precaution from previous hospitalizations Required Braces or Orthoses: No Restrictions Weight Bearing Restrictions: No Prior Functioning Home Living Lives With:  (going home with mother) Dolores Lory Help From: Family Type of Home: House Home Layout: One level Home Access: Stairs to enter Entrance Stairs-Rails: None Entrance Stairs-Number of Steps: 3 Bathroom Shower/Tub: Tub/shower unit;Walk-in shower Bathroom Toilet: Standard Bathroom Accessibility: Yes How Accessible: Accessible via walker Home Adaptive Equipment: Straight cane;Bedside commode/3-in-1;Walker - rolling;Shower chair with back;Reacher;Long-handled sponge Additional Comments: ` Prior Function Level of Independence: Needs assistance  with ADLs;Requires assistive device for independence Dressing: Moderate Driving: No Vocation: Unemployed Comments: states that her mother has been assisting with LB adls ADL ADL Eating/Feeding: Performed;Set up Where Assessed - Eating/Feeding: Chair Grooming: Performed;Teeth care;Set up Where Assessed - Grooming: Sitting, chair;Supported Lower Body Dressing: Performed;+1 Total assistance Lower Body Dressing Details (indicate cue type and reason): unable to don socks supine or sitting Where Assessed - Lower Body Dressing: Supine, head of bed up Toilet Transfer: Simulated;Minimal assistance Toilet Transfer Method: Proofreader: Raised toilet seat with arms (or 3-in-1 over toilet) Toileting - Clothing Manipulation: Simulated;Minimal assistance Where Assessed - Toileting Clothing Manipulation: Sit to stand from 3-in-1 or toilet Toileting - Hygiene: Simulated;Minimal assistance Where Assessed - Toileting Hygiene: Sit to stand from 3-in-1 or toilet Equipment Used: Rolling walker ADL Comments: Pt moving slowly due to c/o severe pain. Pt requires total (A) for LB adls Vision/Perception  Vision - History Baseline Vision: No visual deficits Cognition Cognition Arousal/Alertness: Lethargic Overall Cognitive Status: Appears within functional limits for tasks assessed Orientation Level: Oriented X4 Sensation/Coordination Sensation Light Touch: Appears Intact Coordination Gross Motor Movements are Fluid and Coordinated: Yes Fine Motor Movements are Fluid and Coordinated: Yes Extremity Assessment RUE Assessment RUE Assessment: Within Functional Limits (grossly) LUE Assessment LUE Assessment: Within Functional Limits (grossly) Mobility  Bed Mobility Bed Mobility: Yes Rolling Right: 4: Min assist;With rail Rolling Right Details (indicate cue type and reason): mod v/c for sequencing Right Sidelying to Sit: 3: Mod assist;With rails;HOB elevated (comment  degrees) Right Sidelying to Sit Details (indicate cue type and reason): supervision for safety and cueing for back precautions Sitting - Scoot to Edge of Bed: 5: Supervision;With rail Transfers Transfers: Yes Sit to Stand: 3: Mod assist;With upper extremity assist;From bed Stand to Sit: 3:  Mod assist;With upper extremity assist;To chair/3-in-1;With armrests Stand to Sit Details: Verbal cues for hand placement and control of descent Exercises   End of Session OT - End of Session Equipment Utilized During Treatment: Gait belt Activity Tolerance: Patient limited by pain Patient left: in chair;with call bell in reach (pt asked to sit up for 15 minutes) Nurse Communication: Mobility status for transfers;Mobility status for ambulation General Behavior During Session: [facility] for tasks performed Cognition: [facility] for tasks performed  Co tx with [facility] (PT)   Harrel Carina [facility] 06/16/2011, 11:50 AM  Pager: [phone]

## 2011-06-16 NOTE — Progress Notes (Signed)
Patient ID: Julia [name], female   DOB: 10-23-1965, 46 y.o.   MRN: [id number] Stable. C/o of incisional pain. No weakness. i will be out of town and dr Danielle Dess and my partners to take over her care

## 2011-06-16 NOTE — Progress Notes (Signed)
Patient ID: Julia [name], female   DOB: November 27, 1965, 46 y.o.   MRN: [id number] Afebril. C/o incisional pain. Able to move both legs. Foley out . ID to see and help. Will get a tlso for pain control

## 2011-06-16 NOTE — Op Note (Signed)
[name], [name] NO.:  [id number]  MEDICAL RECORD NO.:  [id number]  LOCATION:  3101                         FACILITY:  [facility]  PHYSICIAN:  Hilda Lias, M.D.   DATE OF BIRTH:  [birth year]  DATE OF PROCEDURE:  06/15/2011 DATE OF DISCHARGE:                              OPERATIVE REPORT   PREOPERATIVE DIAGNOSIS:  L4-L5 diskitis status post L4-L5 diskectomy. Incision and drainage of lumbar wound.  POSTOPERATIVE DIAGNOSIS:  L4-L5 diskitis status post L4-L5 diskectomy. Incision and drainage of lumbar wound.  PROCEDURE:  Exploration of the lumbar wound, bilateral L4-L5 laminectomy.  Decompression of thecal sac.  Specimens taken from the epidural space for culture.  Microscope.  SURGEON:  Hilda Lias, M.D.  ASSISTANT:  Julia [name], M.D.  CLINICAL HISTORY:  Julia [name] is a 46 year old female, obese, diabetic, who in the past has undergone surgery in the lumbar area.  In January, she had a diskectomy for a herniated disk and later on around mid February, she was taken back to Surgery by Dr. Phoebe [name] because of recurrence of pain posterolaterally.  The patient did well but she came to my office with some drainage from the lumbar wound.  I and D was done.  Most of the infection was in the epidural space.  The patient did well.  I saw her in my office last week.  She was doing great with no pain but yesterday, she called the office because she was getting worse with the swelling with radiation to both legs.  She was admitted and MRI was obtained, which showed diskitis at the level of L4-L5 and displacement of the thecal sac.  Because of the possibility of partial treatment with IV antibiotics and recurrence of the diskitis and because of the stenosis, we decided to take her to surgery.  I spoke with her, her mother, and sister.  All knew about the risks and benefits of the surgery.  DESCRIPTION OF PROCEDURE:  The patient was taken to the OR and  after intubation, she was positioned in a prone manner.  The back was cleaned with DuraPrep.  Midline incision following the previous one was made. The incision was carried all the way down to the spinous process.  At that area, there was no evidence as before or any infection.  Then, we proceeded to retract the muscle away from the spinous process.  We entered the area where she had surgery.  At the level of L4-L5, right side, we found quite a bit of scar tissue.  It was difficult to mobilize the thecal tissue.  Because of that and stenosis, we decided to proceed with removal of spinous process of L4-L5 x2 with bilateral laminectomy. We were able to decompress the thecal sac with good space up and below. Then, we started exploring the ventral aspect.  Dissection was carried out.  We were unable to find any evidence of any active infection. Mostly what we found was fibrosis.  Nevertheless, we entered the disk space.  We took some specimen and we will send it to the laboratory for cultures.  From then on, the area was irrigated.  Because she has diabetes and previous surgery  with inflammation, she has a tendency to bleed more than normal.  Surgifoam was used for hemostasis.  We left a Hemovac in the epidural space and the wound was closed with Vicryl and staples. Because of her condition, she is told to go to the ICU only for the night.  She shall wake and move her both legs.          ______________________________ Hilda Lias, M.D.     EB/MEDQ  D:  06/15/2011  T:  06/16/2011  Job:  [id number]

## 2011-06-17 LAB — CBC
HCT: 28.1 % — ABNORMAL LOW (ref 36.0–46.0)
Hemoglobin: 9.6 g/dL — ABNORMAL LOW (ref 12.0–15.0)
MCHC: 34.2 g/dL (ref 30.0–36.0)
RDW: 13.6 % (ref 11.5–15.5)
WBC: 6 10*3/uL (ref 4.0–10.5)

## 2011-06-17 LAB — GLUCOSE, CAPILLARY
Glucose-Capillary: 123 mg/dL — ABNORMAL HIGH (ref 70–99)
Glucose-Capillary: 111 mg/dL — ABNORMAL HIGH (ref 70–99)
Glucose-Capillary: 156 mg/dL — ABNORMAL HIGH (ref 70–99)

## 2011-06-17 LAB — BASIC METABOLIC PANEL
Chloride: 101 mEq/L (ref 96–112)
GFR calc Af Amer: >90 mL/min (ref >90)
GFR calc non Af Amer: >90 mL/min (ref >90)
Potassium: 3.5 mEq/L (ref 3.5–5.1)
Sodium: 138 mEq/L (ref 135–145)

## 2011-06-17 MED ORDER — SODIUM CHLORIDE 0.9 % IV SOLN
1750.0000 mg | Freq: Two times a day (BID) | INTRAVENOUS | Status: DC
  Administered 2011-06-17 – 2011-06-20 (×7): 1750 mg via INTRAVENOUS
  Filled 2011-06-17 (×8): qty 1750

## 2011-06-17 NOTE — Progress Notes (Signed)
ANTIBIOTIC CONSULT NOTE - FOLLOW UP  Pharmacy Consult for Vanco Indication:  discitis + osteo/spinal abscess  Allergies  Allergen Reactions  . Codeine Hives  . Gabapentin Rash    Patient Measurements: Height: 5\' 5"  (165.1 cm) Weight: 210 lb 4.8 oz (95.391 kg) IBW/kg (Calculated) : 57  Adjusted Body Weight:   Vital Signs: Temp: 98.2 F (36.8 C) (03/29 0800) Temp src: Oral (03/29 0800) BP: 124/82 mmHg (03/29 1100) Pulse Rate: 92  (03/29 1100) Intake/Output from previous day: 03/28 0701 - 03/29 0700 In: 3015 [P.O.:1440; I.V.:975; IV Piggyback:600] Out: 2336 [Urine:2270; Drains:66] Intake/Output from this shift: Total I/O In: 536 [P.O.:236; I.V.:300] Out: -   Labs:  Basename 06/17/11 0350 06/15/11 0540 06/14/11 1927 06/14/11 1148  WBC 6.0 7.7 8.4 --  HGB 9.6* 10.5* 12.3 --  PLT 125* 136* 142* --  LABCREA -- -- -- --  CREATININE 0.41* 0.42* -- 0.36*   Estimated Creatinine Clearance: 99.4 ml/min (by C-G formula based on Cr of 0.41).  Basename 06/17/11 0350  VANCOTROUGH 14.8  VANCOPEAK --  Drue Dun --  GENTTROUGH --  GENTPEAK --  GENTRANDOM --  TOBRATROUGH --  TOBRAPEAK --  TOBRARND --  AMIKACINPEAK --  AMIKACINTROU --  AMIKACIN --     Microbiology: Recent Results (from the past 720 hour(s))  SURGICAL PCR SCREEN     Status: Normal   Collection Time   05/20/11  9:02 AM      Component Value Range Status Comment   MRSA, PCR NEGATIVE  NEGATIVE  Final    Staphylococcus aureus NEGATIVE  NEGATIVE  Final   TISSUE CULTURE     Status: Normal   Collection Time   05/20/11  4:21 PM      Component Value Range Status Comment   Specimen Description TISSUE BACK   Final    Special Requests     Final    Value: PATIENT ON FOLLOWING ANCEF LUMBAR WOUND DEBRIDEMENT   Gram Stain     Final    Value: RARE WBC PRESENT, PREDOMINANTLY PMN     NO ORGANISMS SEEN   Culture NO GROWTH 3 DAYS   Final    Report Status 05/24/2011 FINAL   Final   ANAEROBIC CULTURE     Status: Normal     Collection Time   05/20/11  4:22 PM      Component Value Range Status Comment   Specimen Description TISSUE BACK   Final    Special Requests     Final    Value: PATIENT ON FOLLOWING ANCEF LUMBAR WOUND DEBRIDEMENT   Gram Stain     Final    Value: RARE WBC PRESENT, PREDOMINANTLY PMN     NO ORGANISMS SEEN   Culture NO ANAEROBES ISOLATED   Final    Report Status 05/25/2011 FINAL   Final   WOUND CULTURE     Status: Normal   Collection Time   05/20/11  4:24 PM      Component Value Range Status Comment   Specimen Description WOUND BACK   Final    Special Requests PT ON ANCEF   Final    Gram Stain     Final    Value: MODERATE WBC PRESENT, PREDOMINANTLY PMN     RARE SQUAMOUS EPITHELIAL CELLS PRESENT     NO ORGANISMS SEEN   Culture NO GROWTH 2 DAYS   Final    Report Status 05/23/2011 FINAL   Final   ANAEROBIC CULTURE     Status: Normal  Collection Time   05/20/11  4:24 PM      Component Value Range Status Comment   Specimen Description WOUND BACK   Final    Special Requests PT ON ANCEF   Final    Gram Stain     Final    Value: MODERATE WBC PRESENT, PREDOMINANTLY PMN     RARE SQUAMOUS EPITHELIAL CELLS PRESENT     NO ORGANISMS SEEN   Culture NO ANAEROBES ISOLATED   Final    Report Status 05/25/2011 FINAL   Final   GRAM STAIN     Status: Normal   Collection Time   06/15/11  7:04 PM      Component Value Range Status Comment   Specimen Description WOUND BACK   Final    Special Requests PATIENT ON FOLLOWING VANCOMYCIN LUMBAR WOUND   Final    Gram Stain     Final    Value: RARE WBC PRESENT, PREDOMINANTLY MONONUCLEAR     NO ORGANISMS SEEN   Report Status 06/15/2011 FINAL   Final   WOUND CULTURE     Status: Normal (Preliminary result)   Collection Time   06/15/11  7:04 PM      Component Value Range Status Comment   Specimen Description WOUND BACK   Final    Special Requests PATIENT ON FOLLOWING VANCOMYCIN LUMBAR WOUND   Final    Gram Stain     Final    Value: RARE WBC PRESENT,  PREDOMINANTLY MONONUCLEAR     NO ORGANISMS SEEN     Performed at [facility]   Culture NO GROWTH 1 DAY   Final    Report Status PENDING   Incomplete   ANAEROBIC CULTURE     Status: Normal (Preliminary result)   Collection Time   06/15/11  7:04 PM      Component Value Range Status Comment   Specimen Description WOUND BACK   Final    Special Requests PATIENT ON FOLLOWING VANCOMYCIN LUMBAR WOUND   Final    Gram Stain     Final    Value: RARE WBC PRESENT, PREDOMINANTLY MONONUCLEAR     NO ORGANISMS SEEN     Performed at [facility]   Culture     Final    Value: NO ANAEROBES ISOLATED; CULTURE IN PROGRESS FOR 5 DAYS   Report Status PENDING   Incomplete   GRAM STAIN     Status: Normal   Collection Time   06/15/11  7:06 PM      Component Value Range Status Comment   Specimen Description TISSUE   Final    Special Requests PATIENT ON FOLLOWING VANCOMYCIN DISC SPACE   Final    Gram Stain     Final    Value: ABUNDANT WBC PRESENT,BOTH PMN AND MONONUCLEAR     NO ORGANISMS SEEN   Report Status 06/15/2011 FINAL   Final   TISSUE CULTURE     Status: Normal (Preliminary result)   Collection Time   06/15/11  7:06 PM      Component Value Range Status Comment   Specimen Description TISSUE   Final    Special Requests PATIENT ON FOLLOWING VANCOMYCIN DISC SPACE   Final    Gram Stain     Final    Value: ABUNDANT WBC PRESENT,BOTH PMN AND MONONUCLEAR     NO ORGANISMS SEEN     Performed at [facility]   Culture NO GROWTH 1 DAY   Final  Report Status PENDING   Incomplete   ANAEROBIC CULTURE     Status: Normal (Preliminary result)   Collection Time   06/15/11  7:06 PM      Component Value Range Status Comment   Specimen Description TISSUE   Final    Special Requests PATIENT ON FOLLOWING VANCOMYCIN DISC SPACE   Final    Gram Stain     Final    Value: ABUNDANT WBC PRESENT,BOTH PMN AND MONONUCLEAR     NO ORGANISMS SEEN     Performed at [facility]   Culture     Final     Value: NO ANAEROBES ISOLATED; CULTURE IN PROGRESS FOR 5 DAYS   Report Status PENDING   Incomplete     Anti-infectives     Start     Dose/Rate Route Frequency Ordered Stop   06/16/11 0400   ceFEPIme (MAXIPIME) 2 g in dextrose 5 % 50 mL IVPB        2 g 100 mL/hr over 30 Minutes Intravenous Every 12 hours 06/15/11 1749     06/14/11 2200   vancomycin (VANCOCIN) IVPB 1000 mg/200 mL premix        1,000 mg 200 mL/hr over 60 Minutes Intravenous Every 8 hours 06/14/11 1933            Assessment: 46 yo F s/p original diskectomy 04/10/11. Back to OR 05/04/11 for nerve compression. She was started on ancef and cipro and was changed to Rocephin 2 gm IV q 24 on 3/2 -3/29 (4 wks) by ID to continue as outpt via PICC line. Admitted with worsening pain and unable to walk. MRI = "Intense appearing diskitis and osteomyelitis at L4-5 with a large phlegmon extending from the L4 to mid L5 and a small epidural abscess causing marked compression of the thecal sac. Paraspinous abscess on the left and subcutaneous rim enhancing fluid collection also consistent with abscess is identified. "   ID: Treat for discitis + osteo/spinal abscess.Tmax 100.2 WBC down to 6. Surgery 3/27 to I&D the lumbar area. Cultures pending. Vanco trough this am 14.8 done 0350?? Instead of at 0500 as scheduled therefore true trough likely lowers. MD asking during rounds to change to BID dosing. Also on Cefepime 2g/12h.  Cards: Max BP 148/92, HR 91-114 off her Norvasc.  Neuro: Risperdal for bipolar affective d/o  DM: CBG 81-143 on SSI. (took NPH at home)  Pulm: COPD,Spiriva  GI/Nutrition: Liver cirrhosis. K+ replaced to 3.5 now on KDur 20 bid.  Heme/Onc: Hgb down to 9.6. Hgb A1C only 5.8?  Home meds: Not resumed: Sima, NPH, lisinopril/HCTZ  Goal of Therapy:  Vancomycin trough level 15-20 mcg/ml  Plan:  Increase Vancomycin 1750mg  BID. + Cefepime 2g/12h x 8 weeks.   Jaron Czarnecki S. Merilynn Finland, PharmD, BCPS Clinical Staff  Pharmacist Pager [phone]  06/17/2011,11:17 AM

## 2011-06-17 NOTE — Progress Notes (Signed)
Physical Therapy Treatment Patient Details Name: Julia [name] MRN: [id number] DOB: Aug 02, 1965 Today's Date: 06/17/2011  PT Assessment/Plan  PT - Assessment/Plan Comments on Treatment Session: pt presents s/p spinal infection and multiple back surgeries.  pt moving slowly and requires max encouragement for increase activity and participation.   PT Plan: Discharge plan remains appropriate;Frequency remains appropriate PT Frequency: Min 5X/week Follow Up Recommendations: Home health PT;Supervision/Assistance - 24 hour Equipment Recommended: None recommended by PT PT Goals  Acute Rehab PT Goals PT Goal: Rolling Supine to Right Side - Progress: Progressing toward goal PT Goal: Supine/Side to Sit - Progress: Progressing toward goal PT Goal: Sit to Stand - Progress: Progressing toward goal PT Goal: Stand to Sit - Progress: Progressing toward goal PT Transfer Goal: Bed to Chair/Chair to Bed - Progress: Progressing toward goal PT Goal: Ambulate - Progress: Progressing toward goal Additional Goals PT Goal: Additional Goal #1 - Progress: Progressing toward goal  PT Treatment Precautions/Restrictions  Precautions Precautions: Back;Fall Precaution Booklet Issued: No Precaution Comments: pt verbalized 3/3 back precautions.   Required Braces or Orthoses: No Restrictions Weight Bearing Restrictions: No Mobility (including Balance) Bed Mobility Bed Mobility: Yes Rolling Right: 4: Min assist;With rail Rolling Right Details (indicate cue type and reason): cues for log roll and back precautions.   Right Sidelying to Sit: 4: Min assist;HOB elevated (comment degrees) (HOB ~15degrees) Right Sidelying to Sit Details (indicate cue type and reason): cues for technique and back precautions Sitting - Scoot to Edge of Bed: 5: Supervision Transfers Transfers: Yes Sit to Stand: 4: Min assist;With upper extremity assist;From bed Sit to Stand Details (indicate cue type and reason): cues for UE use,  encouragement.   Stand to Sit: 4: Min assist;With upper extremity assist;With armrests;To chair/3-in-1 Stand to Sit Details: cues for use of armrests, control descent Ambulation/Gait Ambulation/Gait: Yes Ambulation/Gait Assistance: 4: Min assist Ambulation/Gait Assistance Details (indicate cue type and reason): cues for upright posture, use of RW.   Ambulation Distance (Feet): 20 Feet Assistive device: Rolling walker Gait Pattern: Step-to pattern;Decreased stride length;Trunk flexed Gait velocity: Decreased Stairs: No Wheelchair Mobility Wheelchair Mobility: No  Posture/Postural Control Posture/Postural Control: No significant limitations Exercise    End of Session PT - End of Session Equipment Utilized During Treatment: Gait belt Activity Tolerance: Patient limited by pain Patient left: in chair;with call bell in reach Nurse Communication: Mobility status for transfers;Mobility status for ambulation;Other (comment) General Behavior During Session: [facility] for tasks performed Cognition: [facility] for tasks performed  [name], [place] [phone] 06/17/2011, 9:09 AM

## 2011-06-17 NOTE — Progress Notes (Signed)
Occupational Therapy Treatment Patient Details Name: Julia [name] MRN: [id number] DOB: [birth year] Today's Date: 06/17/2011  OT Assessment/Plan OT Assessment/Plan Comments on Treatment Session: Pt tolerated OOB <>chair 06/16/11 for 15 minutes and today tolerating OOB <>chair for 45 minutes. Pt progressing slowly. OT Plan: Discharge plan remains appropriate OT Frequency: Min 2X/week Equipment Recommended: None recommended by PT OT Goals ADL Goals Pt Will Transfer to Toilet: with modified independence;Regular height toilet ADL Goal: Toilet Transfer - Progress: Progressing toward goals Pt Will Perform Toileting - Clothing Manipulation: with modified independence;Sitting on 3-in-1 or toilet ADL Goal: Toileting - Clothing Manipulation - Progress: Progressing toward goals Miscellaneous OT Goals Miscellaneous OT Goal #1: Pt will verbalized 3 out 3 back precautions as precursor to ADLS OT Goal: Miscellaneous Goal #1 - Progress: Progressing toward goals  OT Treatment Precautions/Restrictions  Precautions Precautions: Back;Fall Precaution Booklet Issued: No Precaution Comments: pt verbalized 3/3 back precautions.   Required Braces or Orthoses: No Restrictions Weight Bearing Restrictions: No   ADL ADL Eating/Feeding: Simulated;Set up Where Assessed - Eating/Feeding: Bed level Grooming: Performed;Teeth care;Set up Where Assessed - Grooming: Sitting, chair;Supported Location manager Dressing: Performed;Minimal assistance Upper Body Dressing Details (indicate cue type and reason): don gown as robe Where Assessed - Upper Body Dressing: Sitting, bed;Unsupported Toilet Transfer: Simulated;Other (comment) (min guard (A)) Toilet Transfer Method: Proofreader: Raised toilet seat with arms (or 3-in-1 over toilet) Equipment Used: Rolling walker Ambulation Related to ADLs: pt ambulating to door way Min guard (A) ADL Comments: Pt with soft BP 100/71 during session and limited by  c/o severe pain 10 out 10 to therapist and then when RN arrived with medication reports 7 out 10 pain.  Mobility  Bed Mobility Bed Mobility: Yes Rolling Right: 4: Min assist;With rail Rolling Right Details (indicate cue type and reason): cues for log roll Right Sidelying to Sit: 4: Min assist;HOB elevated (comment degrees) (hob 15 degrees) Right Sidelying to Sit Details (indicate cue type and reason): cue for sequence and back precautions Sitting - Scoot to Edge of Bed: 5: Supervision Sitting - Scoot to Edge of Bed Details (indicate cue type and reason): supervision for safety Transfers Transfers: Yes Sit to Stand: 4: Min assist;With upper extremity assist;From bed Sit to Stand Details (indicate cue type and reason): cues for ue use encouragement Stand to Sit: 4: Min assist;With upper extremity assist;With armrests;To chair/3-in-1 Stand to Sit Details: cues for use of armrest to control descend to chair Exercises    End of Session OT - End of Session Equipment Utilized During Treatment: Gait belt Activity Tolerance: Patient limited by pain Patient left: in chair;with call bell in reach Nurse Communication: Mobility status for transfers;Mobility status for ambulation General Behavior During Session: [facility] for tasks performed Cognition: [facility] for tasks performed Cognitive Impairment: Slow processing  Lucile Shutters  06/17/2011, 10:33 AM Pager: [phone]

## 2011-06-17 NOTE — Progress Notes (Signed)
CARE MANAGEMENT NOTE 06/17/2011  Patient:  Julia [name], Julia [name]   Account Number:  [id number]  Date Initiated:  06/17/2011  Documentation initiated by:  Vance Peper  Subjective/Objective Assessment:     Action/Plan:   Received call from Marzetta Board with [facility] [phone].  pt was serviced by them thru [facility] [phone]- Call for [facility] needs over weekend.        Status of service:  In process, will continue to follow  Discharge Disposition:

## 2011-06-17 NOTE — Progress Notes (Signed)
INFECTIOUS DISEASE PROGRESS NOTE    Date of Admission:  06/14/2011   Total days of antibiotics 4        Day 4 vancomycin        Day 2 cefepime        S/p ceftriaxone 3/3-3/26 Active Problems:  Diabetes mellitus  Increased wound drainage  COPD (chronic obstructive pulmonary disease)  HTN (hypertension)  Bipolar affective disorder      . ceFEPime (MAXIPIME) IV  2 g Intravenous Q12H  . docusate sodium  100 mg Oral BID  . insulin aspart  0-5 Units Subcutaneous QHS  . potassium chloride  20 mEq Oral BID  . risperiDONE  1 mg Oral Daily  . risperiDONE  2 mg Oral QHS  . sodium chloride  3 mL Intravenous Q12H  . tiotropium  18 mcg Inhalation Daily  . vancomycin  1,750 mg Intravenous Q12H  . white petrolatum      . DISCONTD: insulin aspart  0-15 Units Subcutaneous TID WC  . DISCONTD: vancomycin  1,000 mg Intravenous Q8H    Subjective: Pain improved  Objective: Temp:  [98.2 F (36.8 C)-100.2 F (37.9 C)] 99.2 F (37.3 C) (03/29 1158) Pulse Rate:  [89-114] 92  (03/29 1100) Resp:  [11-19] 18  (03/29 1100) BP: (102-148)/(56-92) 124/82 mmHg (03/29 1100) SpO2:  [86 %-100 %] 94 % (03/29 1100)  General: Awake, alert, mild distress with pain Skin: no rashes Lungs: CTA B Cor: RRR without m/r/g Abdomen: soft, ntnd   Lab Results Lab Results  Component Value Date   WBC 6.0 06/17/2011   HGB 9.6* 06/17/2011   HCT 28.1* 06/17/2011   MCV 89.8 06/17/2011   PLT 125* 06/17/2011    Lab Results  Component Value Date   CREATININE 0.41* 06/17/2011   BUN 3* 06/17/2011   NA 138 06/17/2011   K 3.5 06/17/2011   CL 101 06/17/2011   CO2 29 06/17/2011    Lab Results  Component Value Date   ALT 31 05/20/2011   AST 60* 05/20/2011   ALKPHOS 52 05/20/2011   BILITOT 0.6 05/20/2011       Microbiology: Recent Results (from the past 240 hour(s))  GRAM STAIN     Status: Normal   Collection Time   06/15/11  7:04 PM      Component Value Range Status Comment   Specimen Description WOUND BACK   Final    Special Requests PATIENT ON FOLLOWING VANCOMYCIN LUMBAR WOUND   Final    Gram Stain     Final    Value: RARE WBC PRESENT, PREDOMINANTLY MONONUCLEAR     NO ORGANISMS SEEN   Report Status 06/15/2011 FINAL   Final   WOUND CULTURE     Status: Normal (Preliminary result)   Collection Time   06/15/11  7:04 PM      Component Value Range Status Comment   Specimen Description WOUND BACK   Final    Special Requests PATIENT ON FOLLOWING VANCOMYCIN LUMBAR WOUND   Final    Gram Stain     Final    Value: RARE WBC PRESENT, PREDOMINANTLY MONONUCLEAR     NO ORGANISMS SEEN     Performed at [facility]   Culture NO GROWTH 1 DAY   Final    Report Status PENDING   Incomplete   ANAEROBIC CULTURE     Status: Normal (Preliminary result)   Collection Time   06/15/11  7:04 PM      Component Value Range Status Comment  Specimen Description WOUND BACK   Final    Special Requests PATIENT ON FOLLOWING VANCOMYCIN LUMBAR WOUND   Final    Gram Stain     Final    Value: RARE WBC PRESENT, PREDOMINANTLY MONONUCLEAR     NO ORGANISMS SEEN     Performed at [facility]   Culture     Final    Value: NO ANAEROBES ISOLATED; CULTURE IN PROGRESS FOR 5 DAYS   Report Status PENDING   Incomplete   GRAM STAIN     Status: Normal   Collection Time   06/15/11  7:06 PM      Component Value Range Status Comment   Specimen Description TISSUE   Final    Special Requests PATIENT ON FOLLOWING VANCOMYCIN DISC SPACE   Final    Gram Stain     Final    Value: ABUNDANT WBC PRESENT,BOTH PMN AND MONONUCLEAR     NO ORGANISMS SEEN   Report Status 06/15/2011 FINAL   Final   TISSUE CULTURE     Status: Normal (Preliminary result)   Collection Time   06/15/11  7:06 PM      Component Value Range Status Comment   Specimen Description TISSUE   Final    Special Requests PATIENT ON FOLLOWING VANCOMYCIN DISC SPACE   Final    Gram Stain     Final    Value: ABUNDANT WBC PRESENT,BOTH PMN AND MONONUCLEAR     NO ORGANISMS SEEN      Performed at [facility]   Culture NO GROWTH 1 DAY   Final    Report Status PENDING   Incomplete   ANAEROBIC CULTURE     Status: Normal (Preliminary result)   Collection Time   06/15/11  7:06 PM      Component Value Range Status Comment   Specimen Description TISSUE   Final    Special Requests PATIENT ON FOLLOWING VANCOMYCIN DISC SPACE   Final    Gram Stain     Final    Value: ABUNDANT WBC PRESENT,BOTH PMN AND MONONUCLEAR     NO ORGANISMS SEEN     Performed at [facility]   Culture     Final    Value: NO ANAEROBES ISOLATED; CULTURE IN PROGRESS FOR 5 DAYS   Report Status PENDING   Incomplete     Studies/Results: No results found.   Assessment: 46 yo with discitis.  Surgery did not find active pus.  S/p laminectomy.    Plan: 1. Continue with broad spectrum antibiotics pending cultures.     Staci Righter, MD

## 2011-06-17 NOTE — Progress Notes (Signed)
Patient ID: Julia [name], female   DOB: 1965/12/21, 46 y.o.   MRN: [id number] Patient's dressing has been draining serosanguineous fluid from around drain site. Drain itself is only producing approximately 40 cc per shift. Dressing removed and drain removed. Incision painted with Betadine.  Neurologically patient remains intact. Motor function is good and lower extremities. Back pain is tolerated with difficulty. Continue to observe him unit today. May be transferred to floor tomorrow.

## 2011-06-17 NOTE — Progress Notes (Signed)
Subjective: No complains. Got up for 15 minutes today. Objective: Filed Vitals:   06/17/11 0600 06/17/11 0700 06/17/11 0800 06/17/11 0900  BP:  109/67 109/67 117/70  Pulse: 92 89 95 104  Temp:  98.2 F (36.8 C) 98.2 F (36.8 C)   TempSrc:  Oral Oral   Resp: 16 14 11 17   Height:      Weight:      SpO2: 100% 100% 99% 97%   Weight change:   Intake/Output Summary (Last 24 hours) at 06/17/11 1012 Last data filed at 06/17/11 1000  Gross per 24 hour  Intake   2771 ml  Output   1986 ml  Net    785 ml    General: Alert, awake, oriented x3, in no acute distress.  HEENT: No bruits, no goiter.  Heart: Regular rate and rhythm, without murmurs, rubs, gallops.  Lungs: good air movement CTA b/l  Abdomen: Soft, nontender, nondistended, positive bowel sounds.  Neuro: not asses   Lab Results:  Basename 06/17/11 0350 06/15/11 0540 06/14/11 1148  NA 138 135 136  K 3.5 3.1* 2.9*  CL 101 100 98  CO2 29 27 28   GLUCOSE 132* 123* 137*  BUN 3* 6 5*  CREATININE 0.41* 0.42* 0.36*  CALCIUM 9.0 9.2 10.1  MG -- -- --  PHOS -- -- --   No results found for this basename: AST:2,ALT:2,ALKPHOS:2,BILITOT:2,PROT:2,ALBUMIN:2 in the last 72 hours No results found for this basename: LIPASE:2,AMYLASE:2 in the last 72 hours  Basename 06/17/11 0350 06/15/11 0540 06/14/11 1927  WBC 6.0 7.7 --  NEUTROABS -- -- 4.6  HGB 9.6* 10.5* --  HCT 28.1* 31.9* --  MCV 89.8 90.4 --  PLT 125* 136* --   No results found for this basename: CKTOTAL:3,CKMB:3,CKMBINDEX:3,TROPONINI:3 in the last 72 hours No components found with this basename: POCBNP:3 No results found for this basename: DDIMER:2 in the last 72 hours  Basename 06/15/11 1555  HGBA1C 5.8*   No results found for this basename: CHOL:2,HDL:2,LDLCALC:2,TRIG:2,CHOLHDL:2,LDLDIRECT:2 in the last 72 hours No results found for this basename: TSH,T4TOTAL,FREET3,T3FREE,THYROIDAB in the last 72 hours No results found for this basename:  VITAMINB12:2,FOLATE:2,FERRITIN:2,TIBC:2,IRON:2,RETICCTPCT:2 in the last 72 hours  Micro Results: Recent Results (from the past 240 hour(s))  GRAM STAIN     Status: Normal   Collection Time   06/15/11  7:04 PM      Component Value Range Status Comment   Specimen Description WOUND BACK   Final    Special Requests PATIENT ON FOLLOWING VANCOMYCIN LUMBAR WOUND   Final    Gram Stain     Final    Value: RARE WBC PRESENT, PREDOMINANTLY MONONUCLEAR     NO ORGANISMS SEEN   Report Status 06/15/2011 FINAL   Final   WOUND CULTURE     Status: Normal (Preliminary result)   Collection Time   06/15/11  7:04 PM      Component Value Range Status Comment   Specimen Description WOUND BACK   Final    Special Requests PATIENT ON FOLLOWING VANCOMYCIN LUMBAR WOUND   Final    Gram Stain     Final    Value: RARE WBC PRESENT, PREDOMINANTLY MONONUCLEAR     NO ORGANISMS SEEN     Performed at [facility]   Culture NO GROWTH 1 DAY   Final    Report Status PENDING   Incomplete   ANAEROBIC CULTURE     Status: Normal (Preliminary result)   Collection Time   06/15/11  7:04 PM  Component Value Range Status Comment   Specimen Description WOUND BACK   Final    Special Requests PATIENT ON FOLLOWING VANCOMYCIN LUMBAR WOUND   Final    Gram Stain     Final    Value: RARE WBC PRESENT, PREDOMINANTLY MONONUCLEAR     NO ORGANISMS SEEN     Performed at [facility]   Culture     Final    Value: NO ANAEROBES ISOLATED; CULTURE IN PROGRESS FOR 5 DAYS   Report Status PENDING   Incomplete   GRAM STAIN     Status: Normal   Collection Time   06/15/11  7:06 PM      Component Value Range Status Comment   Specimen Description TISSUE   Final    Special Requests PATIENT ON FOLLOWING VANCOMYCIN DISC SPACE   Final    Gram Stain     Final    Value: ABUNDANT WBC PRESENT,BOTH PMN AND MONONUCLEAR     NO ORGANISMS SEEN   Report Status 06/15/2011 FINAL   Final   TISSUE CULTURE     Status: Normal (Preliminary result)    Collection Time   06/15/11  7:06 PM      Component Value Range Status Comment   Specimen Description TISSUE   Final    Special Requests PATIENT ON FOLLOWING VANCOMYCIN DISC SPACE   Final    Gram Stain     Final    Value: ABUNDANT WBC PRESENT,BOTH PMN AND MONONUCLEAR     NO ORGANISMS SEEN     Performed at [facility]   Culture NO GROWTH 1 DAY   Final    Report Status PENDING   Incomplete   ANAEROBIC CULTURE     Status: Normal (Preliminary result)   Collection Time   06/15/11  7:06 PM      Component Value Range Status Comment   Specimen Description TISSUE   Final    Special Requests PATIENT ON FOLLOWING VANCOMYCIN DISC SPACE   Final    Gram Stain     Final    Value: ABUNDANT WBC PRESENT,BOTH PMN AND MONONUCLEAR     NO ORGANISMS SEEN     Performed at [facility]   Culture     Final    Value: NO ANAEROBES ISOLATED; CULTURE IN PROGRESS FOR 5 DAYS   Report Status PENDING   Incomplete     Studies/Results: No results found.  Medications: I have reviewed the patient's current medications.  Assessment and plan: Active Problems:  Diabetes mellitus: D/c meal coverage, HbgA1c 5.8. ? If she does not need that much insulin at home.   Increased wound drainage/diskitis/osteomyelitis: -Per neuro and ID. -vancomycin and cefepime started on 3.26.2013   COPD (chronic obstructive pulmonary disease) Stable.   HTN (hypertension) Controlled.   Bipolar affective disorder  continue home meds.     LOS: 3 days   Marinda Elk M.D. Pager: [phone] [facility] 06/17/2011, 10:12 AM

## 2011-06-18 LAB — WOUND CULTURE

## 2011-06-18 LAB — GLUCOSE, CAPILLARY
Glucose-Capillary: 114 mg/dL — ABNORMAL HIGH (ref 70–99)
Glucose-Capillary: 138 mg/dL — ABNORMAL HIGH (ref 70–99)
Glucose-Capillary: 146 mg/dL — ABNORMAL HIGH (ref 70–99)

## 2011-06-18 NOTE — Progress Notes (Signed)
Subjective: No complains.  Objective: Filed Vitals:   06/18/11 0800 06/18/11 0900 06/18/11 1000 06/18/11 1100  BP: 120/69 114/76 125/84 113/72  Pulse: 91 101 91 82  Temp: 98 F (36.7 C)     TempSrc:      Resp: 13 15 10 10   Height:      Weight:      SpO2: 94% 99% 95% 99%   Weight change:   Intake/Output Summary (Last 24 hours) at 06/18/11 1112 Last data filed at 06/18/11 1000  Gross per 24 hour  Intake   2155 ml  Output   3575 ml  Net  -1420 ml    General: Alert, awake, oriented x3, in no acute distress.  HEENT: No bruits, no goiter.  Heart: Regular rate and rhythm, without murmurs, rubs, gallops.  Lungs: good air movement CTA b/l  Abdomen: Soft, nontender, nondistended, positive bowel sounds.  Neuro: not asses   Lab Results:  Basename 06/17/11 0350  NA 138  K 3.5  CL 101  CO2 29  GLUCOSE 132*  BUN 3*  CREATININE 0.41*  CALCIUM 9.0  MG --  PHOS --   No results found for this basename: AST:2,ALT:2,ALKPHOS:2,BILITOT:2,PROT:2,ALBUMIN:2 in the last 72 hours No results found for this basename: LIPASE:2,AMYLASE:2 in the last 72 hours  Basename 06/17/11 0350  WBC 6.0  NEUTROABS --  HGB 9.6*  HCT 28.1*  MCV 89.8  PLT 125*   No results found for this basename: CKTOTAL:3,CKMB:3,CKMBINDEX:3,TROPONINI:3 in the last 72 hours No components found with this basename: POCBNP:3 No results found for this basename: DDIMER:2 in the last 72 hours  Basename 06/15/11 1555  HGBA1C 5.8*   No results found for this basename: CHOL:2,HDL:2,LDLCALC:2,TRIG:2,CHOLHDL:2,LDLDIRECT:2 in the last 72 hours No results found for this basename: TSH,T4TOTAL,FREET3,T3FREE,THYROIDAB in the last 72 hours No results found for this basename: VITAMINB12:2,FOLATE:2,FERRITIN:2,TIBC:2,IRON:2,RETICCTPCT:2 in the last 72 hours  Micro Results: Recent Results (from the past 240 hour(s))  GRAM STAIN     Status: Normal   Collection Time   06/15/11  7:04 PM      Component Value Range Status Comment     Specimen Description WOUND BACK   Final    Special Requests PATIENT ON FOLLOWING VANCOMYCIN LUMBAR WOUND   Final    Gram Stain     Final    Value: RARE WBC PRESENT, PREDOMINANTLY MONONUCLEAR     NO ORGANISMS SEEN   Report Status 06/15/2011 FINAL   Final   WOUND CULTURE     Status: Normal   Collection Time   06/15/11  7:04 PM      Component Value Range Status Comment   Specimen Description WOUND BACK   Final    Special Requests PATIENT ON FOLLOWING VANCOMYCIN LUMBAR WOUND   Final    Gram Stain     Final    Value: RARE WBC PRESENT, PREDOMINANTLY MONONUCLEAR     NO ORGANISMS SEEN     Performed at [facility]   Culture NO GROWTH 2 DAYS   Final    Report Status 06/18/2011 FINAL   Final   ANAEROBIC CULTURE     Status: Normal (Preliminary result)   Collection Time   06/15/11  7:04 PM      Component Value Range Status Comment   Specimen Description WOUND BACK   Final    Special Requests PATIENT ON FOLLOWING VANCOMYCIN LUMBAR WOUND   Final    Gram Stain     Final    Value: RARE WBC PRESENT,  PREDOMINANTLY MONONUCLEAR     NO ORGANISMS SEEN     Performed at [facility]   Culture     Final    Value: NO ANAEROBES ISOLATED; CULTURE IN PROGRESS FOR 5 DAYS   Report Status PENDING   Incomplete   GRAM STAIN     Status: Normal   Collection Time   06/15/11  7:06 PM      Component Value Range Status Comment   Specimen Description TISSUE   Final    Special Requests PATIENT ON FOLLOWING VANCOMYCIN DISC SPACE   Final    Gram Stain     Final    Value: ABUNDANT WBC PRESENT,BOTH PMN AND MONONUCLEAR     NO ORGANISMS SEEN   Report Status 06/15/2011 FINAL   Final   TISSUE CULTURE     Status: Normal (Preliminary result)   Collection Time   06/15/11  7:06 PM      Component Value Range Status Comment   Specimen Description TISSUE   Final    Special Requests PATIENT ON FOLLOWING VANCOMYCIN DISC SPACE   Final    Gram Stain     Final    Value: ABUNDANT WBC PRESENT,BOTH PMN AND MONONUCLEAR      NO ORGANISMS SEEN     Performed at [facility]   Culture NO GROWTH 2 DAYS   Final    Report Status PENDING   Incomplete   ANAEROBIC CULTURE     Status: Normal (Preliminary result)   Collection Time   06/15/11  7:06 PM      Component Value Range Status Comment   Specimen Description TISSUE   Final    Special Requests PATIENT ON FOLLOWING VANCOMYCIN DISC SPACE   Final    Gram Stain     Final    Value: ABUNDANT WBC PRESENT,BOTH PMN AND MONONUCLEAR     NO ORGANISMS SEEN     Performed at [facility]   Culture     Final    Value: NO ANAEROBES ISOLATED; CULTURE IN PROGRESS FOR 5 DAYS   Report Status PENDING   Incomplete     Studies/Results: No results found.  Medications: I have reviewed the patient's current medications.  Assessment and plan:   Diabetes mellitus: -HbgA1c 5.8.  -Metformin for outpatient. She is not in her natural home diet.  -will need some coverage for home.   Increased wound drainage/diskitis/osteomyelitis: -Per neuro and ID. -vancomycin and cefepime started on 3.26.2013   COPD (chronic obstructive pulmonary disease) -Stable.   HTN (hypertension) -Controlled.   Bipolar affective disorder -continue home meds.     LOS: 4 days   Marinda Elk M.D. Pager: [phone] [facility] 06/18/2011, 11:12 AM

## 2011-06-18 NOTE — Progress Notes (Signed)
Patient ID: Julia [name], female   DOB: 1965/04/10, 46 y.o.   MRN: [id number] Subjective:  The patient is alert and pleasant. He complains of back pain. She is ambulating adequately.  Objective: Vital signs in last 24 hours: Temp:  [98 F (36.7 C)-99.4 F (37.4 C)] 98 F (36.7 C) (03/30 0800) Pulse Rate:  [87-102] 91  (03/30 0800) Resp:  [11-20] 13  (03/30 0800) BP: (107-132)/(69-85) 120/69 mmHg (03/30 0800) SpO2:  [90 %-99 %] 94 % (03/30 0800)  Intake/Output from previous day: 03/29 0701 - 03/30 0700 In: 2331 [P.O.:556; I.V.:675; IV Piggyback:1100] Out: 2925 [Urine:2920; Drains:5] Intake/Output this shift: Total I/O In: -  Out: 250 [Urine:250]  Physical exam the patient is alert and oriented. Her lower extremity strength is grossly normal.  Lab Results:  [facility] 06/17/11 0350  WBC 6.0  HGB 9.6*  HCT 28.1*  PLT 125*   BMET  Basename 06/17/11 0350  NA 138  K 3.5  CL 101  CO2 29  GLUCOSE 132*  BUN 3*  CREATININE 0.41*  CALCIUM 9.0    Studies/Results: No results found.  Assessment/Plan: Discitis: The patient is being treated with antibiotics.  LOS: 4 days     Calah Gershman D 06/18/2011, 9:03 AM

## 2011-06-18 NOTE — Progress Notes (Signed)
CSW received consult for SNF. PT/OT recommendation for [facility] noted. No other CSW needs identified. CSW signing off. Please re-consult if SNF needed.  Dellie Burns, MSW, [place] [phone] (weekend)

## 2011-06-18 NOTE — Progress Notes (Signed)
Physical Therapy Treatment Patient Details Name: Julia [name] MRN: [id number] DOB: [birth year] Today's Date: 06/18/2011  PT Assessment/Plan  PT - Assessment/Plan Comments on Treatment Session: Increased gait distance today with less assistance needed for mobility. Pt continues to mobilize slowly, however did not need encouragement to get out of bed today. More motivated to participate with PT. RN notified of pt's mobility status and plans to continue to mobilize pt with bathroom (3N1) instead of bedpan for continued strengthening and increased activity tolerance.              PT Plan: Discharge plan remains appropriate;Frequency remains appropriate PT Frequency: Min 5X/week Follow Up Recommendations: Home health PT;Supervision/Assistance - 24 hour Equipment Recommended: None recommended by PT PT Goals  Acute Rehab PT Goals PT Goal: Rolling Supine to Right Side - Progress: Progressing toward goal PT Goal: Supine/Side to Sit - Progress: Progressing toward goal PT Goal: Sit to Stand - Progress: Progressing toward goal PT Goal: Stand to Sit - Progress: Progressing toward goal PT Transfer Goal: Bed to Chair/Chair to Bed - Progress: Progressing toward goal PT Goal: Ambulate - Progress: Progressing toward goal Additional Goals PT Goal: Additional Goal #1 - Progress: Progressing toward goal  PT Treatment Precautions/Restrictions  Precautions Precautions: Back;ICD/Pacemaker Precaution Booklet Issued: No Precaution Comments: pt able to verbilize 3/3 back precautions Required Braces or Orthoses: No Restrictions Weight Bearing Restrictions: No Mobility (including Balance) Bed Mobility Rolling Right: 4: Min assist;With rail (min guard assist) Rolling Right Details (indicate cue type and reason): min cues to bend legs before rolling onto side. pt able to roll self without assistance Right Sidelying to Sit: 4: Min assist;With rails;HOB elevated (comment degrees) (min guard assist; HOB approx  15*) Right Sidelying to Sit Details (indicate cue type and reason): min cues to bring legs off edge of bed while using arms to raise trunk into sitting. no assistance required. increased time required for pt to perform task without assistance and safely. Sitting - Scoot to Edge of Bed: 5: Supervision Transfers Sit to Stand: 4: Min assist;From bed;With upper extremity assist (min guard assist) Sit to Stand Details (indicate cue type and reason): cues for hand placement for safety with transfers (pt attempting to pull up on walker) and for ant wt shifting to achieve standing. cues for erect posture once standing. Stand to Sit: 5: Supervision;With upper extremity assist;To chair/3-in-1;With armrests Stand to Sit Details: cues to reach back and to use arms to control descent with sitting down, pt with controlled descent into chair. Ambulation/Gait Ambulation/Gait Assistance: 4: Min assist Ambulation/Gait Assistance Details (indicate cue type and reason): min cues for upright posture and for walker positioning with gait. Ambulation Distance (Feet): 40 Feet Assistive device: Rolling walker Gait Pattern: Step-through pattern;Decreased step length - right;Decreased step length - left;Trunk flexed;Decreased stride length Gait velocity: Decreased Stairs: No  Posture/Postural Control Posture/Postural Control: No significant limitations    End of Session PT - End of Session Equipment Utilized During Treatment: Gait belt Activity Tolerance: Patient tolerated treatment well;Patient limited by pain Patient left: in chair;with call bell in reach Nurse Communication: Mobility status for transfers;Mobility status for ambulation General Behavior During Session: [facility] for tasks performed Cognition: [facility] for tasks performed  Sallyanne Kuster 06/18/2011, 8:59 AM  Sallyanne Kuster, PTA Office- [phone] Pager- [phone]

## 2011-06-18 NOTE — Progress Notes (Signed)
INFECTIOUS DISEASE PROGRESS NOTE    Date of Admission:  06/14/2011   Total days of antibiotics 5        Day 5 vancomycin        Day 3 cefepime        S/p ceftriaxone 3/3-3/26 Active Problems:  Diabetes mellitus  Increased wound drainage  COPD (chronic obstructive pulmonary disease)  HTN (hypertension)  Bipolar affective disorder      . ceFEPime (MAXIPIME) IV  2 g Intravenous Q12H  . docusate sodium  100 mg Oral BID  . insulin aspart  0-5 Units Subcutaneous QHS  . potassium chloride  20 mEq Oral BID  . risperiDONE  1 mg Oral Daily  . risperiDONE  2 mg Oral QHS  . sodium chloride  3 mL Intravenous Q12H  . tiotropium  18 mcg Inhalation Daily  . vancomycin  1,750 mg Intravenous Q12H    Subjective: Patient reports not having any recent fevers. Still not having optimal pain control. She is working with pt/ot, walks/sits to chair. Otherwise no n/v/d  Objective: Temp:  [98 F (36.7 C)-99.4 F (37.4 C)] 99.1 F (37.3 C) (03/30 1209) Pulse Rate:  [82-101] 82  (03/30 1100) Resp:  [10-20] 10  (03/30 1100) BP: (107-132)/(69-85) 113/72 mmHg (03/30 1100) SpO2:  [90 %-99 %] 99 % (03/30 1100)  General: Awake, alert, in NAD. Slightly dishelveled. HEENT= PERRLA, EOMI, no scleral icterus.  Lungs: CTA B Cor: RRR without m/r/g Abdomen: soft, ntnd Skin: no rashes  Lab Results Lab Results  Component Value Date   WBC 6.0 06/17/2011   HGB 9.6* 06/17/2011   HCT 28.1* 06/17/2011   MCV 89.8 06/17/2011   PLT 125* 06/17/2011    Lab Results  Component Value Date   CREATININE 0.41* 06/17/2011   BUN 3* 06/17/2011   NA 138 06/17/2011   K 3.5 06/17/2011   CL 101 06/17/2011   CO2 29 06/17/2011    Lab Results  Component Value Date   ALT 31 05/20/2011   AST 60* 05/20/2011   ALKPHOS 52 05/20/2011   BILITOT 0.6 05/20/2011       Microbiology: Recent Results (from the past 240 hour(s))  GRAM STAIN     Status: Normal   Collection Time   06/15/11  7:04 PM      Component Value Range Status Comment   Specimen Description WOUND BACK   Final    Special Requests PATIENT ON FOLLOWING VANCOMYCIN LUMBAR WOUND   Final    Gram Stain     Final    Value: RARE WBC PRESENT, PREDOMINANTLY MONONUCLEAR     NO ORGANISMS SEEN   Report Status 06/15/2011 FINAL   Final   WOUND CULTURE     Status: Normal   Collection Time   06/15/11  7:04 PM      Component Value Range Status Comment   Specimen Description WOUND BACK   Final    Special Requests PATIENT ON FOLLOWING VANCOMYCIN LUMBAR WOUND   Final    Gram Stain     Final    Value: RARE WBC PRESENT, PREDOMINANTLY MONONUCLEAR     NO ORGANISMS SEEN     Performed at [facility]   Culture NO GROWTH 2 DAYS   Final    Report Status 06/18/2011 FINAL   Final   ANAEROBIC CULTURE     Status: Normal (Preliminary result)   Collection Time   06/15/11  7:04 PM      Component Value Range Status Comment  Specimen Description WOUND BACK   Final    Special Requests PATIENT ON FOLLOWING VANCOMYCIN LUMBAR WOUND   Final    Gram Stain     Final    Value: RARE WBC PRESENT, PREDOMINANTLY MONONUCLEAR     NO ORGANISMS SEEN     Performed at [facility]   Culture     Final    Value: NO ANAEROBES ISOLATED; CULTURE IN PROGRESS FOR 5 DAYS   Report Status PENDING   Incomplete   GRAM STAIN     Status: Normal   Collection Time   06/15/11  7:06 PM      Component Value Range Status Comment   Specimen Description TISSUE   Final    Special Requests PATIENT ON FOLLOWING VANCOMYCIN DISC SPACE   Final    Gram Stain     Final    Value: ABUNDANT WBC PRESENT,BOTH PMN AND MONONUCLEAR     NO ORGANISMS SEEN   Report Status 06/15/2011 FINAL   Final   TISSUE CULTURE     Status: Normal (Preliminary result)   Collection Time   06/15/11  7:06 PM      Component Value Range Status Comment   Specimen Description TISSUE   Final    Special Requests PATIENT ON FOLLOWING VANCOMYCIN DISC SPACE   Final    Gram Stain     Final    Value: ABUNDANT WBC PRESENT,BOTH PMN AND MONONUCLEAR      NO ORGANISMS SEEN     Performed at [facility]   Culture NO GROWTH 2 DAYS   Final    Report Status PENDING   Incomplete   ANAEROBIC CULTURE     Status: Normal (Preliminary result)   Collection Time   06/15/11  7:06 PM      Component Value Range Status Comment   Specimen Description TISSUE   Final    Special Requests PATIENT ON FOLLOWING VANCOMYCIN DISC SPACE   Final    Gram Stain     Final    Value: ABUNDANT WBC PRESENT,BOTH PMN AND MONONUCLEAR     NO ORGANISMS SEEN     Performed at [facility]   Culture     Final    Value: NO ANAEROBES ISOLATED; CULTURE IN PROGRESS FOR 5 DAYS   Report Status PENDING   Incomplete     Studies/Results: No results found.   Assessment: 46 yo with discitis.  Surgery did not find active pus.  S/p laminectomy.  Cultures still negative at 72hrs.  Plan: 1. Continue with broad spectrum antibiotics pending cultures.  We will decide on length of therapy and regimen tomorrow.  Duke Salvia Drue Second MD MPH [facility] [phone]

## 2011-06-19 LAB — GLUCOSE, CAPILLARY
Glucose-Capillary: 135 mg/dL — ABNORMAL HIGH (ref 70–99)
Glucose-Capillary: 151 mg/dL — ABNORMAL HIGH (ref 70–99)

## 2011-06-19 LAB — TISSUE CULTURE

## 2011-06-19 NOTE — Progress Notes (Signed)
INFECTIOUS DISEASE PROGRESS NOTE    Date of Admission:  06/14/2011   Total days of antibiotics 6        Day 6 vancomycin        Day 4 cefepime        S/p ceftriaxone 3/3-3/26 Active Problems:  Diabetes mellitus  Increased wound drainage  COPD (chronic obstructive pulmonary disease)  HTN (hypertension)  Bipolar affective disorder      . ceFEPime (MAXIPIME) IV  2 g Intravenous Q12H  . docusate sodium  100 mg Oral BID  . insulin aspart  0-5 Units Subcutaneous QHS  . potassium chloride  20 mEq Oral BID  . risperiDONE  1 mg Oral Daily  . risperiDONE  2 mg Oral QHS  . sodium chloride  3 mL Intravenous Q12H  . tiotropium  18 mcg Inhalation Daily  . vancomycin  1,750 mg Intravenous Q12H    Subjective: Patient reports not having any recent fevers. Still not having optimal pain control. She is working with pt/ot, walks/sits to chair. Otherwise no n/v/d  Objective: Temp:  [97.9 F (36.6 C)-98.8 F (37.1 C)] 97.9 F (36.6 C) (03/31 1200) Pulse Rate:  [78-96] 81  (03/31 1300) Resp:  [7-20] 14  (03/31 1300) BP: (110-148)/(69-107) 129/88 mmHg (03/31 1300) SpO2:  [90 %-99 %] 93 % (03/31 1300)  General: Awake, alert, in NAD. Slightly dishelveled. HEENT= PERRLA, EOMI, no scleral icterus.  Lungs: CTA B Cor: RRR without m/r/g Abdomen: soft, ntnd Skin: no rashes  Lab Results Lab Results  Component Value Date   WBC 6.0 06/17/2011   HGB 9.6* 06/17/2011   HCT 28.1* 06/17/2011   MCV 89.8 06/17/2011   PLT 125* 06/17/2011    Lab Results  Component Value Date   CREATININE 0.41* 06/17/2011   BUN 3* 06/17/2011   NA 138 06/17/2011   K 3.5 06/17/2011   CL 101 06/17/2011   CO2 29 06/17/2011    Lab Results  Component Value Date   ALT 31 05/20/2011   AST 60* 05/20/2011   ALKPHOS 52 05/20/2011   BILITOT 0.6 05/20/2011       Microbiology: Recent Results (from the past 240 hour(s))  GRAM STAIN     Status: Normal   Collection Time   06/15/11  7:04 PM      Component Value Range Status Comment   Specimen Description WOUND BACK   Final    Special Requests PATIENT ON FOLLOWING VANCOMYCIN LUMBAR WOUND   Final    Gram Stain     Final    Value: RARE WBC PRESENT, PREDOMINANTLY MONONUCLEAR     NO ORGANISMS SEEN   Report Status 06/15/2011 FINAL   Final   WOUND CULTURE     Status: Normal   Collection Time   06/15/11  7:04 PM      Component Value Range Status Comment   Specimen Description WOUND BACK   Final    Special Requests PATIENT ON FOLLOWING VANCOMYCIN LUMBAR WOUND   Final    Gram Stain     Final    Value: RARE WBC PRESENT, PREDOMINANTLY MONONUCLEAR     NO ORGANISMS SEEN     Performed at [facility]   Culture NO GROWTH 2 DAYS   Final    Report Status 06/18/2011 FINAL   Final   ANAEROBIC CULTURE     Status: Normal (Preliminary result)   Collection Time   06/15/11  7:04 PM      Component Value Range Status Comment  Specimen Description WOUND BACK   Final    Special Requests PATIENT ON FOLLOWING VANCOMYCIN LUMBAR WOUND   Final    Gram Stain     Final    Value: RARE WBC PRESENT, PREDOMINANTLY MONONUCLEAR     NO ORGANISMS SEEN     Performed at [facility]   Culture     Final    Value: NO ANAEROBES ISOLATED; CULTURE IN PROGRESS FOR 5 DAYS   Report Status PENDING   Incomplete   GRAM STAIN     Status: Normal   Collection Time   06/15/11  7:06 PM      Component Value Range Status Comment   Specimen Description TISSUE   Final    Special Requests PATIENT ON FOLLOWING VANCOMYCIN DISC SPACE   Final    Gram Stain     Final    Value: ABUNDANT WBC PRESENT,BOTH PMN AND MONONUCLEAR     NO ORGANISMS SEEN   Report Status 06/15/2011 FINAL   Final   TISSUE CULTURE     Status: Normal   Collection Time   06/15/11  7:06 PM      Component Value Range Status Comment   Specimen Description TISSUE   Final    Special Requests PATIENT ON FOLLOWING VANCOMYCIN DISC SPACE   Final    Gram Stain     Final    Value: ABUNDANT WBC PRESENT,BOTH PMN AND MONONUCLEAR     NO ORGANISMS SEEN      Performed at [facility]   Culture NO GROWTH 3 DAYS   Final    Report Status 06/19/2011 FINAL   Final   ANAEROBIC CULTURE     Status: Normal (Preliminary result)   Collection Time   06/15/11  7:06 PM      Component Value Range Status Comment   Specimen Description TISSUE   Final    Special Requests PATIENT ON FOLLOWING VANCOMYCIN DISC SPACE   Final    Gram Stain     Final    Value: ABUNDANT WBC PRESENT,BOTH PMN AND MONONUCLEAR     NO ORGANISMS SEEN     Performed at [facility]   Culture     Final    Value: NO ANAEROBES ISOLATED; CULTURE IN PROGRESS FOR 5 DAYS   Report Status PENDING   Incomplete     Studies/Results: No results found.   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   Cultures negative.  Plan: Final recommendations is to treat for lumbar osteomyelitis with vancomycin and cefepime for a total of 8 wks. Start date will be 3/28. End date will be Aug 14, 2011. Continue with broad spectrum antibiotics pending cultures.  We will decide on length of therapy and regimen tomorrow.  While on vancomycin, she will need weekly CBC with diff, BMP, and vanco trough. Goal trough is 15-20.  We will have the patient follow up in ID clinic in 6 wks to monitor how she is doing on her treatment for lumbar osteo.  Please call if questions.  Duke Salvia Drue Second MD MPH [facility] [phone]

## 2011-06-19 NOTE — Progress Notes (Signed)
Patient ID: Julia [name], female   DOB: [birth year], 46 y.o.   MRN: [id number] Subjective: Patient reports Leg pain  Objective: Vital signs in last 24 hours: Temp:  [98.2 F (36.8 C)-99.1 F (37.3 C)] 98.2 F (36.8 C) (03/31 0800) Pulse Rate:  [78-94] 87  (03/31 1000) Resp:  [10-19] 15  (03/31 0700) BP: (110-148)/(69-107) 130/76 mmHg (03/31 1000) SpO2:  [90 %-98 %] 94 % (03/31 1000)  Intake/Output from previous day: 03/30 0701 - 03/31 0700 In: 1350 [P.O.:360; I.V.:440; IV Piggyback:550] Out: 4300 [Urine:4300] Intake/Output this shift: Total I/O In: 63 [I.V.:63] Out: 325 [Urine:325]  Wound:Some drainage, but looks good otherwise.  Lab Results:  [facility] 06/17/11 0350  WBC 6.0  HGB 9.6*  HCT 28.1*  PLT 125*   BMET  Basename 06/17/11 0350  NA 138  K 3.5  CL 101  CO2 29  GLUCOSE 132*  BUN 3*  CREATININE 0.41*  CALCIUM 9.0    Studies/Results: No results found.  Assessment/Plan: Stable. On antibiotics per ID service.  LOS: 5 days  As above   Reinaldo Meeker, MD 06/19/2011, 11:02 AM

## 2011-06-19 NOTE — Plan of Care (Signed)
Problem: Phase I Progression Outcomes Goal: Pain controlled with appropriate interventions Outcome: Progressing 3/31 - Pt continues to indicating pain in "butt and legs." Physician reviewed medications and did not add new medications.

## 2011-06-20 LAB — ANAEROBIC CULTURE

## 2011-06-20 LAB — VANCOMYCIN, TROUGH: Vancomycin Tr: 10.8 ug/mL (ref 10.0–20.0)

## 2011-06-20 LAB — GLUCOSE, CAPILLARY
Glucose-Capillary: 156 mg/dL — ABNORMAL HIGH (ref 70–99)
Glucose-Capillary: 157 mg/dL — ABNORMAL HIGH (ref 70–99)
Glucose-Capillary: 123 mg/dL — ABNORMAL HIGH (ref 70–99)

## 2011-06-20 MED ORDER — VANCOMYCIN HCL IN DEXTROSE 1-5 GM/200ML-% IV SOLN
1000.0000 mg | INTRAVENOUS | Status: AC
  Administered 2011-06-20: 1000 mg via INTRAVENOUS
  Filled 2011-06-20: qty 200

## 2011-06-20 MED ORDER — VANCOMYCIN HCL 1000 MG IV SOLR
2500.0000 mg | Freq: Two times a day (BID) | INTRAVENOUS | Status: DC
  Filled 2011-06-20: qty 2500

## 2011-06-20 MED ORDER — SODIUM CHLORIDE 0.9 % IV SOLN
250.0000 mL | INTRAVENOUS | Status: DC
  Administered 2011-06-20: 17:00:00 via INTRAVENOUS
  Administered 2011-06-21: 250 mL via INTRAVENOUS

## 2011-06-20 MED ORDER — VANCOMYCIN HCL 1000 MG IV SOLR
2500.0000 mg | Freq: Two times a day (BID) | INTRAVENOUS | Status: DC
  Administered 2011-06-21 – 2011-06-22 (×4): 2500 mg via INTRAVENOUS
  Filled 2011-06-20 (×7): qty 2500

## 2011-06-20 NOTE — Progress Notes (Signed)
Physical Therapy Treatment Patient Details Name: Julia [name] MRN: [id number] DOB: 05/16/1965 Today's Date: 06/20/2011  PT Assessment/Plan  PT - Assessment/Plan Comments on Treatment Session: Pt again making good gains with ambulation however locks her knees out during stance phase bilaterally. Pt appeare to not have sufficient strength to maintain neutral knee position which places pt at high risk for falls. Encouraged pt to sit in chair for all meals.  PT Plan: Discharge plan remains appropriate;Frequency remains appropriate PT Frequency: Min 5X/week Follow Up Recommendations: Home health PT;Supervision/Assistance - 24 hour Equipment Recommended: None recommended by PT PT Goals  Acute Rehab PT Goals Pt will Roll Supine to Right Side: Independently PT Goal: Rolling Supine to Right Side - Progress: Progressing toward goal Pt will go Supine/Side to Sit: Independently PT Goal: Supine/Side to Sit - Progress: Progressing toward goal Pt will go Sit to Stand: with modified independence PT Goal: Sit to Stand - Progress: Progressing toward goal Pt will go Stand to Sit: with modified independence PT Goal: Stand to Sit - Progress: Progressing toward goal Pt will Ambulate: >150 feet;with least restrictive assistive device;with supervision PT Goal: Ambulate - Progress: Progressing toward goal Additional Goals Additional Goal #1: Pt will verbalize and demosntrate 3/3 back precautions throughout treatment session. PT Goal: Additional Goal #1 - Progress: Progressing toward goal  PT Treatment Precautions/Restrictions  Precautions Precautions: Back;ICD/Pacemaker Precaution Booklet Issued: No Precaution Comments: pt able to verbilize 2/3 back precautions, needed cues for "bending" precaution Required Braces or Orthoses: No Restrictions Weight Bearing Restrictions: No Mobility (including Balance) Bed Mobility Rolling Right: 5: Supervision Rolling Right Details (indicate cue type and reason): From  Lt. sidelying to supine. Cues for log roll Rolling Left: Other (comment);4: Min assist (min guard assist) Rolling Left Details (indicate cue type and reason): Verbal cues for sequence/log roll technique. Tactile cues to promote lower extremity sequence.  Left Sidelying to Sit: 4: Min assist Left Sidelying to Sit Details (indicate cue type and reason): Assist secondary to weakness. Verbal cues to maintain precautions, tactile cues for pelvis and trunk positioning.  Sitting - Scoot to Edge of Bed: 5: Supervision Sit to Sidelying Left: 5: Supervision Sit to Sidelying Left Details (indicate cue type and reason): Verbal cues for maintaining precautions, no physical assist needed.  Scooting to [facility]: 3: Mod assist Scooting to [facility] Details (indicate cue type and reason): Pt able to contribute with bil. LEs. Assistance provided with pad.  Transfers Transfers: Yes Sit to Stand: 4: Min assist;From bed;With upper extremity assist Sit to Stand Details (indicate cue type and reason): Cues for safe hand placement however pt not following at this point and prefers to pull on RW. Pt with premature knee extension and excessive trunk flexion to compensate for bil. quad weakness. Stand to Sit: 5: Supervision;With upper extremity assist;With armrests;To bed Stand to Sit Details: cues to reach back and to use arms to control descent with sitting down, Ambulation/Gait Ambulation/Gait: Yes Ambulation/Gait Assistance: 4: Min assist Ambulation/Gait Assistance Details (indicate cue type and reason): Pt with bilateral recurvatuum, pt unable to maintain knee in neutral position and reports she feels they will buckle if she does so. Pt utilizing bony alignment as she apparently does not have sufficient quad or hip extensor strength. Pt with excessive hip flexion and forward trunk lean with gait.  Ambulation Distance (Feet): 100 Feet Assistive device: Rolling walker Gait Pattern: Step-through pattern;Decreased step length -  right;Decreased step length - left;Trunk flexed;Decreased stride length;Left genu recurvatum;Right genu recurvatum Gait velocity: Decreased Stairs: No  Exercise  General Exercises - Lower Extremity Quad Sets: AROM;Both;Supine;10 reps (to perform 5-10 every hour, 5 sec holds) End of Session PT - End of Session Equipment Utilized During Treatment: Gait belt Activity Tolerance: Patient tolerated treatment well;Patient limited by pain Patient left: with call bell in reach;in bed (Pt had been in chair for an hour prior to session) Nurse Communication: Mobility status for transfers;Mobility status for ambulation General Behavior During Session: [facility] for tasks performed Cognition: [facility] for tasks performed  Wilhemina Bonito 06/20/2011, 12:26 PM  Sherie Don) Carleene Mains PT, DPT Acute Rehabilitation [phone]

## 2011-06-20 NOTE — Progress Notes (Signed)
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 Codeine Hives  . Gabapentin Rash    Patient Measurements: Height: 5\' 5"  (165.1 cm) Weight: 210 lb 4.8 oz (95.391 kg) IBW/kg (Calculated) : 57    Vital Signs: Temp: 98.1 F (36.7 C) (04/01 0800) Temp src: Oral (04/01 0800) BP: 139/88 mmHg (04/01 1200) Pulse Rate: 131  (04/01 1207)  Intake/Output from previous day: 03/31 0701 - 04/01 0700 In: 2077 [P.O.:1044; I.V.:483; IV Piggyback:550] Out: 2775 [Urine:2775]  Intake/Output from this shift: Total I/O In: 1053 [P.O.:520; I.V.:33; IV Piggyback:500] Out: 1125 [Urine:1125]  Labs: Estimated Creatinine Clearance: 99.4 ml/min (by C-G formula based on Cr of 0.41). Vancomycin trough level = 10.8 mcg/ml   Microbiology: Recent Results (from the past 720 hour(s))  GRAM STAIN     Status: Normal   Collection Time   06/15/11  7:04 PM      Component Value Range Status Comment   Specimen Description WOUND BACK   Final    Special Requests PATIENT ON FOLLOWING VANCOMYCIN LUMBAR WOUND   Final    Gram Stain     Final    Value: RARE WBC PRESENT, PREDOMINANTLY MONONUCLEAR     NO ORGANISMS SEEN   Report Status 06/15/2011 FINAL   Final   WOUND CULTURE     Status: Normal   Collection Time   06/15/11  7:04 PM      Component Value Range Status Comment   Specimen Description WOUND BACK   Final    Special Requests PATIENT ON FOLLOWING VANCOMYCIN LUMBAR WOUND   Final    Gram Stain     Final    Value: RARE WBC PRESENT, PREDOMINANTLY MONONUCLEAR     NO ORGANISMS SEEN     Performed at [facility]   Culture NO GROWTH 2 DAYS   Final    Report Status 06/18/2011 FINAL   Final   ANAEROBIC CULTURE     Status: Normal   Collection Time   06/15/11  7:04 PM      Component Value Range Status Comment   Specimen Description WOUND BACK   Final    Special Requests PATIENT ON FOLLOWING  VANCOMYCIN LUMBAR WOUND   Final    Gram Stain     Final    Value: RARE WBC PRESENT, PREDOMINANTLY MONONUCLEAR     NO ORGANISMS SEEN     Performed at [facility]   Culture NO ANAEROBES ISOLATED   Final    Report Status 06/20/2011 FINAL   Final   GRAM STAIN     Status: Normal   Collection Time   06/15/11  7:06 PM      Component Value Range Status Comment   Specimen Description TISSUE   Final    Special Requests PATIENT ON FOLLOWING VANCOMYCIN DISC SPACE   Final    Gram Stain     Final    Value: ABUNDANT WBC PRESENT,BOTH PMN AND MONONUCLEAR     NO ORGANISMS SEEN   Report Status 06/15/2011 FINAL   Final   TISSUE CULTURE     Status: Normal   Collection Time   06/15/11  7:06 PM      Component Value Range Status Comment   Specimen Description TISSUE   Final    Special Requests PATIENT ON FOLLOWING VANCOMYCIN DISC SPACE   Final    Gram Stain     Final  Value: ABUNDANT WBC PRESENT,BOTH PMN AND MONONUCLEAR     NO ORGANISMS SEEN     Performed at [facility]   Culture NO GROWTH 3 DAYS   Final    Report Status 06/19/2011 FINAL   Final   ANAEROBIC CULTURE     Status: Normal   Collection Time   06/15/11  7:06 PM      Component Value Range Status Comment   Specimen Description TISSUE   Final    Special Requests PATIENT ON FOLLOWING VANCOMYCIN DISC SPACE   Final    Gram Stain     Final    Value: ABUNDANT WBC PRESENT,BOTH PMN AND MONONUCLEAR     NO ORGANISMS SEEN     Performed at [facility]   Culture NO ANAEROBES ISOLATED   Final    Report Status 06/20/2011 FINAL   Final     Anti-infectives     Start     Dose/Rate Route Frequency Ordered Stop   06/17/11 1400   vancomycin (VANCOCIN) 1,750 mg in sodium chloride 0.9 % 500 mL IVPB        1,750 mg 250 mL/hr over 120 Minutes Intravenous Every 12 hours 06/17/11 1125     06/16/11 0400   ceFEPIme (MAXIPIME) 2 g in dextrose 5 % 50 mL IVPB        2 g 100 mL/hr over 30 Minutes Intravenous Every 12 hours 06/15/11 1749       06/14/11 2200   vancomycin (VANCOCIN) IVPB 1000 mg/200 mL premix  Status:  Discontinued        1,000 mg 200 mL/hr over 60 Minutes Intravenous Every 8 hours 06/14/11 1933 06/17/11 1124          Assessment:  Day # 7 Vancomycin, Day # 5 Cefepime for L4 - 5 osteomyelitis/discitis/epidural abscess following laminectomy .  Vancomycin trough SUBtherapeutic for osteo.  Vancomycin = 10.8 mcg/ml.  Plan total of 8 weeks of Vancomycin and Cefepime.  Will need to adjust for home therapy.  Will adjust dose and schedule for q 12 hours vs q 8 hours.  Goal of Therapy:  Vancomycin trough level 15-20 mcg/ml  Plan:   Give additional Vancomycin 1 gm IV now, then  Vancomycin 2500 mg IV q 12 hours.   Elmond Poehlman, Elisha Headland, Pharm.D. 06/20/2011 2:33 PM

## 2011-06-21 LAB — GLUCOSE, CAPILLARY: Glucose-Capillary: 126 mg/dL — ABNORMAL HIGH (ref 70–99)

## 2011-06-21 MED ORDER — METFORMIN HCL 500 MG PO TABS
500.0000 mg | ORAL_TABLET | Freq: Two times a day (BID) | ORAL | Status: DC
  Administered 2011-06-21 – 2011-06-27 (×14): 500 mg via ORAL
  Filled 2011-06-21 (×19): qty 1

## 2011-06-21 NOTE — Progress Notes (Signed)
Utilization review completed. Ivah Girardot, RN, BSN. 06/21/11 

## 2011-06-21 NOTE — Progress Notes (Signed)
Subjective: Patient reports no problems voiding and Complains of diffuse back pain and leg pain  Objective: Vital signs in last 24 hours: Temp:  [97.2 F (36.2 C)-98.5 F (36.9 C)] 98 F (36.7 C) (04/02 1022) Pulse Rate:  [88-131] 107  (04/02 1022) Resp:  [12-18] 18  (04/02 1022) BP: (120-139)/(84-89) 130/89 mmHg (04/02 1022) SpO2:  [92 %-98 %] 98 % (04/02 1022)  Intake/Output from previous day: 04/01 0701 - 04/02 0700 In: 1733 [P.O.:1000; I.V.:33; IV Piggyback:700] Out: 1550 [Urine:1550] Intake/Output this shift: Total I/O In: 360 [P.O.:360] Out: -   Motor function appears intact incision appears clean and dry staples in place dressing change today.  Lab Results: No results found for this basename: WBC:2,HGB:2,HCT:2,PLT:2 in the last 72 hours BMET No results found for this basename: NA:2,K:2,CL:2,CO2:2,GLUCOSE:2,BUN:2,CREATININE:2,CALCIUM:2 in the last 72 hours  Studies/Results: No results found.  Assessment/Plan: Stable afebrile with poor healing wound suspected to be infected however multiple cultures reveal no growth  LOS: 7 days   continue IV anntibiioticss  obtain infectious disease consult regarding antibiotic recommendations.   Julia [name] J 06/21/2011, 11:37 AM

## 2011-06-21 NOTE — Plan of Care (Signed)
Problem: Phase I Progression Outcomes Goal: Initial discharge plan identified Outcome: Progressing Recommend HHOT

## 2011-06-21 NOTE — Progress Notes (Signed)
Physical Therapy Treatment Patient Details Name: Julia [name] MRN: [id number] DOB: 10-Jan-1966 Today's Date: 06/21/2011  PT Assessment/Plan  PT - Assessment/Plan Comments on Treatment Session: Pt making gains with ambulation however continues to lock Rt. knee during stance phase of gait. Pt continues to be very limited by pain, encouraged as much mobility as tolerated.  PT Plan: Discharge plan remains appropriate;Frequency remains appropriate PT Frequency: Min 5X/week Follow Up Recommendations: Home health PT;Supervision/Assistance - 24 hour Equipment Recommended: None recommended by PT PT Goals  Acute Rehab PT Goals Pt will go Supine/Side to Sit: Independently PT Goal: Supine/Side to Sit - Progress: Progressing toward goal Pt will go Sit to Stand: with modified independence PT Goal: Sit to Stand - Progress: Progressing toward goal Pt will go Stand to Sit: with modified independence PT Goal: Stand to Sit - Progress: Progressing toward goal Pt will Ambulate: >150 feet;with least restrictive assistive device;with supervision PT Goal: Ambulate - Progress: Progressing toward goal Additional Goals Additional Goal #1: Pt will verbalize and demosntrate 3/3 back precautions throughout treatment session. PT Goal: Additional Goal #1 - Progress: Progressing toward goal  PT Treatment Precautions/Restrictions  Precautions Precautions: Back;ICD/Pacemaker Precaution Booklet Issued: No Precaution Comments: Pt required min verbal cues for maintaining back precautions during PT session Required Braces or Orthoses: No Restrictions Weight Bearing Restrictions: No Mobility (including Balance) Bed Mobility Bed Mobility: Yes Right Sidelying to Sit: HOB elevated (comment degrees);Other (comment) (Min Guard due to back precautions) Supine to Sit: Other (comment);With rails;HOB elevated (Comment degrees) (ming uard (A)) Transfers Transfers: Yes Sit to Stand: 4: Min assist;From bed;With upper extremity  assist Sit to Stand Details (indicate cue type and reason): Cues for safe hand placement however pt not following at this point and prefers to pull on RW Stand to Sit: 5: Supervision;With upper extremity assist;With armrests;To chair/3-in-1 Stand to Sit Details: cues to reach back and to use arms to control descent with sitting down Ambulation/Gait Ambulation/Gait: Yes Ambulation/Gait Assistance: Other (comment) (min-guard assist) Ambulation/Gait Assistance Details (indicate cue type and reason): Pt with Rt. recurvatuum today, PT attempting to modulate Rt. knee control however pt became more painful with this and reported less stability. Pt continues with excessive hip flexion.  Ambulation Distance (Feet): 120 Feet Assistive device: Rolling walker Gait Pattern: Step-through pattern;Decreased stride length;Trunk flexed;Right genu recurvatum (minimal foot clearance bilaterally)    End of Session PT - End of Session Equipment Utilized During Treatment: Gait belt Activity Tolerance: Patient limited by pain;Patient limited by fatigue Patient left: with call bell in reach;in chair Nurse Communication: Mobility status for transfers;Mobility status for ambulation General Behavior During Session: [facility] for tasks performed Cognition: [facility] for tasks performed Cognitive Impairment: slow processing   Wilhemina Bonito 06/21/2011, 2:37 PM  Sherie Don) Carleene Mains PT, DPT Acute Rehabilitation [phone]

## 2011-06-21 NOTE — Progress Notes (Signed)
Occupational Therapy Treatment Patient Details Name: Julia [name] MRN: [id number] DOB: Aug 17, 1965 Today's Date: 06/21/2011  OT Assessment/Plan OT Assessment/Plan Comments on Treatment Session: Pt tolerating grooming at sink level and ambulation. OT braiding pt hair s/p brushing hair seated in chair. Pt unable to static stand and complete hair care. Endurance is a limiting factor and balance deficits for all adls OT Plan: Discharge plan needs to be updated OT Frequency: Min 2X/week Follow Up Recommendations: Home health OT Equipment Recommended: Defer to next venue OT Goals Acute Rehab OT Goals OT Goal Formulation: With patient ADL Goals Pt Will Perform Lower Body Bathing: with modified independence;Sit to stand from chair;Sit to stand from bed Pt Will Perform Lower Body Dressing: with modified independence;Sit to stand from chair;Sit to stand from bed;with adaptive equipment Pt Will Transfer to Toilet: with modified independence;Regular height toilet ADL Goal: Toilet Transfer - Progress: Progressing toward goals Pt Will Perform Toileting - Clothing Manipulation: with modified independence;Sitting on 3-in-1 or toilet ADL Goal: Toileting - Clothing Manipulation - Progress: Progressing toward goals Pt Will Perform Toileting - Hygiene: with modified independence;Sit to stand from 3-in-1/toilet ADL Goal: Toileting - Hygiene - Progress: Progressing toward goals Miscellaneous OT Goals Miscellaneous OT Goal #1: Pt will verbalized 3 out 3 back precautions as precursor to ADLS OT Goal: Miscellaneous Goal #1 - Progress: Progressing toward goals Miscellaneous OT Goal #2: PT will perform bed mobility with hob <20 moD I level as precursor to sink level ADLS OT Goal: Miscellaneous Goal #2 - Progress: Progressing toward goals  OT Treatment Precautions/Restrictions  Precautions Precautions: Back;ICD/Pacemaker Restrictions Weight Bearing Restrictions: No   ADL ADL Eating/Feeding: Performed;Set  up Where Assessed - Eating/Feeding: Chair Grooming: Performed;Teeth care;Set up;Brushing hair Grooming Details (indicate cue type and reason): reports increased pain with static standing and pain with sitting at buttock Where Assessed - Grooming: Standing at sink Toilet Transfer: Simulated (min guard (A)) Toilet Transfer Method: Proofreader: Raised toilet seat with arms (or 3-in-1 over toilet) Equipment Used: Rolling walker Ambulation Related to ADLs: Pt ambulating Min Guard (A) with Rt LE hyper extending. Pt weight bearing heavily through BIL UE.  ADL Comments: Pt progressing this session with bed mobility and activity tolerance. Pt however reports severe pain with static standing and could benefit from therapy s/p d/c from acute. Mobility  Bed Mobility Bed Mobility: Yes Right Sidelying to Sit: HOB elevated (comment degrees);Other (comment) (Min Guard due to back precautions) Supine to Sit: Other (comment);With rails;HOB elevated (Comment degrees) (ming uard (A)) Transfers Transfers: Yes Sit to Stand: 4: Min assist;From bed;With upper extremity assist Sit to Stand Details (indicate cue type and reason): Cues for safe hand placement however pt not following at this point and prefers to pull on RW. Stand to Sit: 5: Supervision;With upper extremity assist;With armrests;To bed Stand to Sit Details: cues to reach back and to use arms to control descent with sitting down, Exercises    End of Session OT - End of Session Equipment Utilized During Treatment: Gait belt Activity Tolerance: Patient tolerated treatment well Patient left: in chair;with call bell in reach Nurse Communication: Mobility status for transfers;Mobility status for ambulation General Behavior During Session: [facility] for tasks performed Cognition: [facility] for tasks performed Cognitive Impairment: Slow processing  Lucile Shutters  06/21/2011, 10:55 AM Pager: [phone]

## 2011-06-22 LAB — GLUCOSE, CAPILLARY
Glucose-Capillary: 116 mg/dL — ABNORMAL HIGH (ref 70–99)
Glucose-Capillary: 136 mg/dL — ABNORMAL HIGH (ref 70–99)

## 2011-06-22 MED ORDER — VANCOMYCIN HCL 1000 MG IV SOLR
2000.0000 mg | Freq: Two times a day (BID) | INTRAVENOUS | Status: DC
  Administered 2011-06-23 – 2011-06-27 (×10): 2000 mg via INTRAVENOUS
  Filled 2011-06-22 (×11): qty 2000

## 2011-06-22 NOTE — Progress Notes (Signed)
Clinical Social Work Department BRIEF PSYCHOSOCIAL ASSESSMENT 06/22/2011  Patient:  Julia [name], Julia [name]     Account Number:  [id number]     Admit date:  06/14/2011  Clinical Social Worker:  Peggyann Shoals  Date/Time:  06/22/2011 12:00 N  Referred by:  Physician  Date Referred:  06/22/2011 Referred for  Other - See comment   Other Referral:   "discharge planning"   Interview type:  Family Other interview type:    PSYCHOSOCIAL DATA Living Status:  PARENTS Admitted from facility:   Level of care:   Primary support name:  Julia [name] Primary support relationship to patient:  SIBLING Degree of support available:   supportive. [phone]. Pt's mother, who she lives with, Julia [name]. [phone]    CURRENT CONCERNS Current Concerns  Post-Acute Placement   Other Concerns:    SOCIAL WORK ASSESSMENT / PLAN CSW met with pt and family to address discharge plan. Pt's sister and mother are concerned as they are not able to provide the level of care needed at discharge. Until note, pt's discharge plan was to return home with discharge with home health services. Pt shared that she will need additional support as her mother is not able to provide care. Pt is agreeable to SNF at discharge.   Assessment/plan status:  Other - See comment Other assessment/ plan:   CSW will intitate SNF search and follow up with bed offers. CSW will continue to follow.   Information/referral to community resources:   As needed.    PATIENT'S/FAMILY'S RESPONSE TO PLAN OF CARE: Pt and pt's family were pleasant and agreeable to SNF place at discharge.    Julia [name], MSW, Julia [name] [phone]

## 2011-06-22 NOTE — Progress Notes (Signed)
Patient ID: Julia [name], female   DOB: 23-Jan-1966, 46 y.o.   MRN: [id number] Alert and oriented much more so than she has been in the past. Motor function is good in lower extremities. Continues on antibiotics. Placement being sought.

## 2011-06-22 NOTE — Progress Notes (Signed)
Physical Therapy Treatment Patient Details Name: Julia [name] MRN: [id number] DOB: 11/05/1965 Today's Date: 06/22/2011  PT Assessment/Plan  PT - Assessment/Plan Comments on Treatment Session: Pt continues to lock Rt. knee during stance phase of gait. Pt continues to be very limited by pain, encouraged as much mobility as tolerated and encouraged to ambulated 1-2 more times today. Discussed D/C plan with pt and family, now SNF has been requested as pts mother feels she will be unable to care for pt. All are hopeful for more intense physical therapy for pt to return to prior level of function.   PT Plan: Frequency remains appropriate;Discharge plan needs to be updated PT Frequency: Min 5X/week Follow Up Recommendations: Supervision/Assistance - 24 hour;Skilled nursing facility Equipment Recommended: None recommended by PT PT Goals  Acute Rehab PT Goals Pt will Roll Supine to Right Side: Independently PT Goal: Rolling Supine to Right Side - Progress: Progressing toward goal Pt will go Supine/Side to Sit: Independently PT Goal: Supine/Side to Sit - Progress: Progressing toward goal Pt will go Sit to Stand: with modified independence PT Goal: Sit to Stand - Progress: Progressing toward goal Pt will go Stand to Sit: with modified independence PT Goal: Stand to Sit - Progress: Progressing toward goal Pt will Ambulate: >150 feet;with least restrictive assistive device;with supervision PT Goal: Ambulate - Progress: Progressing toward goal Additional Goals Additional Goal #1: Pt will verbalize and demosntrate 3/3 back precautions throughout treatment session. PT Goal: Additional Goal #1 - Progress: Progressing toward goal  PT Treatment Precautions/Restrictions  Precautions Precautions: Back;ICD/Pacemaker Precaution Booklet Issued: No Precaution Comments: Pt required mod verbal cues for maintaining back precautions during PT session. Still has trouble maintaining precautions particularly when in  pain. Required Braces or Orthoses: No Restrictions Weight Bearing Restrictions: No Mobility (including Balance) Bed Mobility Bed Mobility: Yes Rolling Right: 5: Supervision Rolling Right Details (indicate cue type and reason): Cues for log roll  Right Sidelying to Sit: 5: Supervision Right Sidelying to Sit Details (indicate cue type and reason): Verbal cues for back precautions, pt twisting during transition. Sitting - Scoot to Edge of Bed: 5: Supervision Sit to Sidelying Right: 5: Supervision Sit to Sidelying Right Details (indicate cue type and reason): Verbal cues for back precuations, pt in pain and appears to not desire to comply with precautions Transfers Transfers: Yes Sit to Stand: From bed;With upper extremity assist;5: Supervision Sit to Stand Details (indicate cue type and reason): Pt continues to pull on RW to stand, cues for safe hand placement, supervision for safety.. Stand to Sit: 5: Supervision;With upper extremity assist;With armrests;To bed Stand to Sit Details: Again, cues for safe UE placement, pt not following cues Ambulation/Gait Ambulation/Gait: Yes Ambulation/Gait Assistance: Other (comment) (min-guard assist) Ambulation/Gait Assistance Details (indicate cue type and reason): Pt with Rt. recurvatuum again today however pt better able to control with tactile cues. Pt's sister educated on importance  of avoiding recurvatuum.  Ambulation Distance (Feet): 140 Feet Assistive device: Rolling walker Gait Pattern: Step-through pattern;Decreased step length - right;Decreased step length - left;Trunk flexed;Decreased stride length;Right genu recurvatum Gait velocity: Decreased Stairs: No    Exercise  Other Exercises Other Exercises: Educated pt and family on pt performing Rt. long arc quads when in sitting; 10reps x 5 sec holds to promote Rt. quad strength. Pt declining currently secondary to pain. End of Session PT - End of Session Equipment Utilized During  Treatment: Gait belt Activity Tolerance: Patient limited by pain;Patient limited by fatigue Patient left: with call bell in reach;in  bed (pt refusing chair) Nurse Communication: Mobility status for transfers;Mobility status for ambulation General Behavior During Session: [facility] for tasks performed Cognition: [facility] for tasks performed Cognitive Impairment: slow processing   Wilhemina Bonito 06/22/2011, 2:49 PM  Dahlia Client (Beverely Pace) Carleene Mains PT, DPT Acute Rehabilitation [phone]

## 2011-06-22 NOTE — Progress Notes (Signed)
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 Codeine Hives  . Gabapentin Rash    Patient Measurements: Height: 5\' 5"  (165.1 cm) Weight: 210 lb 4.8 oz (95.391 kg) IBW/kg (Calculated) : 57    Vital Signs: Temp: 98 F (36.7 C) (04/03 2111) Temp src: Oral (04/03 2111) BP: 117/80 mmHg (04/03 2111) Pulse Rate: 84  (04/03 2111)  Intake/Output from previous day: 04/02 0701 - 04/03 0700 In: 804 [P.O.:804] Out: -   Intake/Output from this shift:    Labs: Estimated Creatinine Clearance: 99.4 ml/min (by C-G formula based on Cr of 0.41). 06/20/11 Vancomycin trough = 10.8 mcg/ml 06/22/11 Vancomycin trough = 22 mcg/mL  Microbiology: Recent Results (from the past 720 hour(s))  GRAM STAIN     Status: Normal   Collection Time   06/15/11  7:04 PM      Component Value Range Status Comment   Specimen Description WOUND BACK   Final    Special Requests PATIENT ON FOLLOWING VANCOMYCIN LUMBAR WOUND   Final    Gram Stain     Final    Value: RARE WBC PRESENT, PREDOMINANTLY MONONUCLEAR     NO ORGANISMS SEEN   Report Status 06/15/2011 FINAL   Final   WOUND CULTURE     Status: Normal   Collection Time   06/15/11  7:04 PM      Component Value Range Status Comment   Specimen Description WOUND BACK   Final    Special Requests PATIENT ON FOLLOWING VANCOMYCIN LUMBAR WOUND   Final    Gram Stain     Final    Value: RARE WBC PRESENT, PREDOMINANTLY MONONUCLEAR     NO ORGANISMS SEEN     Performed at [facility]   Culture NO GROWTH 2 DAYS   Final    Report Status 06/18/2011 FINAL   Final   ANAEROBIC CULTURE     Status: Normal   Collection Time   06/15/11  7:04 PM      Component Value Range Status Comment   Specimen Description WOUND BACK   Final    Special Requests PATIENT ON FOLLOWING VANCOMYCIN LUMBAR WOUND   Final    Gram Stain     Final    Value: RARE WBC PRESENT,  PREDOMINANTLY MONONUCLEAR     NO ORGANISMS SEEN     Performed at [facility]   Culture NO ANAEROBES ISOLATED   Final    Report Status 06/20/2011 FINAL   Final   GRAM STAIN     Status: Normal   Collection Time   06/15/11  7:06 PM      Component Value Range Status Comment   Specimen Description TISSUE   Final    Special Requests PATIENT ON FOLLOWING VANCOMYCIN DISC SPACE   Final    Gram Stain     Final    Value: ABUNDANT WBC PRESENT,BOTH PMN AND MONONUCLEAR     NO ORGANISMS SEEN   Report Status 06/15/2011 FINAL   Final   TISSUE CULTURE     Status: Normal   Collection Time   06/15/11  7:06 PM      Component Value Range Status Comment   Specimen Description TISSUE   Final    Special Requests PATIENT ON FOLLOWING VANCOMYCIN DISC SPACE   Final    Gram Stain     Final    Value: ABUNDANT WBC PRESENT,BOTH PMN AND  MONONUCLEAR     NO ORGANISMS SEEN     Performed at [facility]   Culture NO GROWTH 3 DAYS   Final    Report Status 06/19/2011 FINAL   Final   ANAEROBIC CULTURE     Status: Normal   Collection Time   06/15/11  7:06 PM      Component Value Range Status Comment   Specimen Description TISSUE   Final    Special Requests PATIENT ON FOLLOWING VANCOMYCIN DISC SPACE   Final    Gram Stain     Final    Value: ABUNDANT WBC PRESENT,BOTH PMN AND MONONUCLEAR     NO ORGANISMS SEEN     Performed at [facility]   Culture NO ANAEROBES ISOLATED   Final    Report Status 06/20/2011 FINAL   Final     Anti-infectives     Start     Dose/Rate Route Frequency Ordered Stop   06/21/11 0400   vancomycin (VANCOCIN) 2,500 mg in sodium chloride 0.9 % 500 mL IVPB  Status:  Discontinued        2,500 mg 250 mL/hr over 120 Minutes Intravenous Every 12 hours 06/20/11 1448 06/20/11 1907   06/21/11 0130   vancomycin (VANCOCIN) 2,500 mg in sodium chloride 0.9 % 500 mL IVPB        2,500 mg 250 mL/hr over 120 Minutes Intravenous Every 12 hours 06/20/11 1907     06/20/11 1500   vancomycin  (VANCOCIN) IVPB 1000 mg/200 mL premix        1,000 mg 200 mL/hr over 60 Minutes Intravenous NOW 06/20/11 1446 06/20/11 1641   06/17/11 1400   vancomycin (VANCOCIN) 1,750 mg in sodium chloride 0.9 % 500 mL IVPB  Status:  Discontinued        1,750 mg 250 mL/hr over 120 Minutes Intravenous Every 12 hours 06/17/11 1125 06/20/11 1906   06/16/11 0400   ceFEPIme (MAXIPIME) 2 g in dextrose 5 % 50 mL IVPB        2 g 100 mL/hr over 30 Minutes Intravenous Every 12 hours 06/15/11 1749     06/14/11 2200   vancomycin (VANCOCIN) IVPB 1000 mg/200 mL premix  Status:  Discontinued        1,000 mg 200 mL/hr over 60 Minutes Intravenous Every 8 hours 06/14/11 1933 06/17/11 1124          Assessment: 46 yo female with L4 - 5 osteomyelitis/discitis/epidural abscess following laminectomy. Patient is on D9 vancomycin and D7 cefepime. Vancomycin trough is slightly above-goal.   Goal of Therapy:  Vancomycin trough level 15-20 mcg/ml  Plan:  1. Decrease vancomycin to 2 gm IV Q12H.   Thad Ranger, Mellody Drown, Pharm.D. 06/22/2011 11:40 PM

## 2011-06-23 LAB — GLUCOSE, CAPILLARY: Glucose-Capillary: 138 mg/dL — ABNORMAL HIGH (ref 70–99)

## 2011-06-23 NOTE — Progress Notes (Signed)
CSW spoke with pt's sister regarding bed offers. CSW shared that pt does not have any confirmed bed offers at this time. CSW will follow up facilities on potential bed offers. CSW to continue to follow.   Dede Query, MSW, Theresia Majors [phone]

## 2011-06-23 NOTE — Progress Notes (Signed)
PT Cancellation Note  Treatment cancelled today due to patient's refusal to participate. Treatment cancelled today due to patient's refusal to participate. Pt states "I am not going to move today. It hurts too much." Educated pt on importance of OOB activity, but pt continued to refuse. Will re-attempt this afternoon as time allows.   Julia [name] 06/23/2011, 10:22 AM   06/23/2011 Julia [name] DPT PAGER: [phone] OFFICE: [phone]

## 2011-06-23 NOTE — Progress Notes (Signed)
OT Cancellation Note   Treatment cancelled today due to patient's refusal to participate. Pt states "I am not going to move today. It hurts too much." Educated pt on importance of OOB activity, but pt continued to refuse.  Will re-attempt this afternoon as time allows.   06/23/2011 Cipriano Mile OTR/L Pager [phone] Office [phone]

## 2011-06-23 NOTE — Progress Notes (Signed)
Patient ID: Julia [name], female   DOB: April 16, 1965, 46 y.o.   MRN: [id number] Alert complains of back and leg pain. Neuro without changes. Continue antibiotics.

## 2011-06-24 ENCOUNTER — Encounter (HOSPITAL_COMMUNITY): Payer: Self-pay | Admitting: Dietician

## 2011-06-24 LAB — GLUCOSE, CAPILLARY
Glucose-Capillary: 124 mg/dL — ABNORMAL HIGH (ref 70–99)
Glucose-Capillary: 121 mg/dL — ABNORMAL HIGH (ref 70–99)

## 2011-06-24 NOTE — Progress Notes (Signed)
SNF bed available at [facility] for Monday 4/8.  Family aware and working on paperwork with SNF.  CSW will continue to follow for d/c planning to SNF on Monday 4/8. [phone]

## 2011-06-24 NOTE — Progress Notes (Signed)
Occupational Therapy Treatment Patient Details Name: Julia [name] MRN: [id number] DOB: March 07, 1966 Today's Date: 06/24/2011  OT Assessment/Plan OT Assessment/Plan Comments on Treatment Session: Endurance and pain continue to be a limiting factor in pt's participation in therapy.  Pt requires max encouragement to participate.   OT Plan: Discharge plan needs to be updated (decreased caregiver support) OT Frequency: Min 2X/week Follow Up Recommendations: Skilled nursing facility Equipment Recommended: Defer to next venue OT Goals Acute Rehab OT Goals OT Goal Formulation: With patient Time For Goal Achievement: 2 weeks ADL Goals Pt Will Transfer to Toilet: with modified independence;Regular height toilet ADL Goal: Toilet Transfer - Progress: Progressing toward goals Pt Will Perform Toileting - Clothing Manipulation: with modified independence;Sitting on 3-in-1 or toilet ADL Goal: Toileting - Clothing Manipulation - Progress: Progressing toward goals Pt Will Perform Toileting - Hygiene: with modified independence;Sit to stand from 3-in-1/toilet ADL Goal: Toileting - Hygiene - Progress: Met Miscellaneous OT Goals Miscellaneous OT Goal #1: Pt will verbalized 3 out 3 back precautions as precursor to ADLS OT Goal: Miscellaneous Goal #1 - Progress: Progressing toward goals Miscellaneous OT Goal #2: PT will perform bed mobility with hob <20 moD I level as precursor to sink level ADLS OT Goal: Miscellaneous Goal #2 - Progress: Met  OT Treatment Precautions/Restrictions  Precautions Precautions: Back;ICD/Pacemaker Precaution Comments: Pt able to verbalize 2/3 back precautions. Required Braces or Orthoses: No Restrictions Weight Bearing Restrictions: No   ADL ADL Toilet Transfer: Performed;Minimal assistance Toilet Transfer Details (indicate cue type and reason): Min assist for safety with RW when turning in bathroom Toilet Transfer Method: Ambulating Toilet Transfer Equipment: Raised  toilet seat with arms (or 3-in-1 over toilet) Toileting - Clothing Manipulation: Performed;Supervision/safety Toileting - Clothing Manipulation Details (indicate cue type and reason): Pt pulled gown up over hips. Where Assessed - Toileting Clothing Manipulation: Sit to stand from 3-in-1 or toilet Toileting - Hygiene: Performed;Independent Where Assessed - Toileting Hygiene: Sit on 3-in-1 or toilet Ambulation Related to ADLs: Min guard assist for safety during ambulation to bathroom ADL Comments: Pt demonstrating impulsive behavior during functional mobility.  Pt encouraged to sit up in chair after toileting task but pt adamantly refused and attempted to increase speed of transfer to bed to avoid sitting in chair. Mobility  Bed Mobility Bed Mobility: Yes Rolling Right: 5: Supervision;With rail Rolling Right Details (indicate cue type and reason): Cues for log roll Right Sidelying to Sit: 4: Min assist;With rails Right Sidelying to Sit Details (indicate cue type and reason): Cueing to maintain back precautions Sitting - Scoot to Edge of Bed: 5: Supervision Sit to Sidelying Right: 5: Supervision Sit to Sidelying Right Details (indicate cue type and reason): Pt able to independently adhere to back precautions. Transfers Transfers: Yes Sit to Stand: From bed;From chair/3-in-1;With armrests;With upper extremity assist;Other (comment) (min guard assist) Sit to Stand Details (indicate cue type and reason): Min guard assist for safety. Stand to Sit: 5: Supervision;To bed;To chair/3-in-1;With upper extremity assist;With armrests Stand to Sit Details: Cueing for safe hand placement. Exercises    End of Session OT - End of Session Equipment Utilized During Treatment: Gait belt Activity Tolerance: Patient limited by pain Patient left: in bed;with call bell in reach;with bed alarm set Nurse Communication: Mobility status for transfers;Mobility status for ambulation;Other (comment)  (pain) General Behavior During Session: Flat affect Cognition: WFL for tasks performed Cognitive Impairment: slow processing, slightly impulsive behavior during transfers  10:32 AM 06/24/2011 Cipriano Mile OTR/L Pager [phone] Office [phone]

## 2011-06-24 NOTE — Consult Note (Signed)
Pt was smoking 1/2 ppd but states that she quit back in Jan 2013 and has been tobacco free since. Congratulated and encouraged pt to remain tobacco free. Discussed relapse prevention strategies. Referred to 1-800 quit now for f/u and support. Discussed oral fixation substitutes, second hand smoke and in home smoking policy. Reviewed and gave pt Written education/contact information.

## 2011-06-24 NOTE — Progress Notes (Signed)
Physical Therapy Treatment Patient Details Name: Julia [name] MRN: [id number] DOB: [birth year] Today's Date: 06/24/2011  PT Assessment/Plan  PT - Assessment/Plan Comments on Treatment Session: Pt mobility limited by endurance and pain. Pt unwilling to ambulate in hallways secondary to pain. Will continue to attempt further ambulation and mobility. PT Plan: Frequency remains appropriate;Discharge plan needs to be updated PT Frequency: Min 5X/week Follow Up Recommendations: Supervision/Assistance - 24 hour;Skilled nursing facility Equipment Recommended: None recommended by PT PT Goals  Acute Rehab PT Goals PT Goal Formulation: With patient PT Goal: Rolling Supine to Right Side - Progress: Progressing toward goal PT Goal: Supine/Side to Sit - Progress: Progressing toward goal PT Goal: Sit to Stand - Progress: Progressing toward goal PT Goal: Stand to Sit - Progress: Progressing toward goal PT Transfer Goal: Bed to Chair/Chair to Bed - Progress: Progressing toward goal PT Goal: Ambulate - Progress: Not progressing Additional Goals PT Goal: Additional Goal #1 - Progress: Progressing toward goal  PT Treatment Precautions/Restrictions  Precautions Precautions: Back;ICD/Pacemaker Precaution Booklet Issued: No Precaution Comments: Pt able to verbalize 2/3 back precautions. Required Braces or Orthoses: No Restrictions Weight Bearing Restrictions: No Mobility (including Balance) Bed Mobility Bed Mobility: Yes Rolling Right: 5: Supervision;With rail Rolling Right Details (indicate cue type and reason): Cues for log roll Right Sidelying to Sit: 4: Min assist;With rails Right Sidelying to Sit Details (indicate cue type and reason): Cueing to maintain back precautions Sitting - Scoot to Edge of Bed: 5: Supervision Sitting - Scoot to Edge of Bed Details (indicate cue type and reason): VC for weight shifting Sit to Sidelying Right: 5: Supervision Sit to Sidelying Right Details (indicate cue  type and reason): Pt able to independently adhere to back precautions. Transfers Transfers: Yes Sit to Stand: From bed;From chair/3-in-1;With armrests;With upper extremity assist;Other (comment) (min guard assist) Sit to Stand Details (indicate cue type and reason): Min guard assist for safety. Stand to Sit: 5: Supervision;To bed;To chair/3-in-1;With upper extremity assist;With armrests Stand to Sit Details: Cueing for safe hand placement. Ambulation/Gait Ambulation/Gait: Yes Ambulation/Gait Assistance: 5: Supervision Ambulation/Gait Assistance Details (indicate cue type and reason): VC for proper sequencing and upright posture. Pt with complaints of LE pain during ambulation, but not difficulties with movement Ambulation Distance (Feet): 20 Feet (to/from bathroom) Assistive device: Rolling walker Gait Pattern: Step-through pattern;Decreased step length - right;Decreased step length - left;Trunk flexed;Decreased stride length;Right genu recurvatum Gait velocity: Decreased Stairs: No    Exercise    End of Session PT - End of Session Equipment Utilized During Treatment: Gait belt Activity Tolerance: Patient limited by pain;Patient limited by fatigue Patient left: in bed;with call bell in reach Nurse Communication: Mobility status for transfers;Mobility status for ambulation General Behavior During Session: Flat affect Cognition: WFL for tasks performed Cognitive Impairment: slow processing, slightly impulsive behavior during transfers  Milana Kidney 06/24/2011, 10:55 AM  06/24/2011 Milana Kidney DPT PAGER: [phone] OFFICE: [phone]

## 2011-06-24 NOTE — Progress Notes (Signed)
Patient ID: Julia [name], female   DOB: 1966-01-19, 45 y.o.   MRN: [id number] Patient continues her recovery from discitis and osteomyelitis complains of her legs feeling weak generally however on motor function testing iliopsoas quadriceps tibialis anterior and gastrocs demonstrate good 4+ out of 5 strength. Her incision is clean dry staples remain intact no drainage is noted. Patient continues on IV antibiotics. To have placement on Monday.

## 2011-06-25 LAB — GLUCOSE, CAPILLARY
Glucose-Capillary: 99 mg/dL (ref 70–99)
Glucose-Capillary: 106 mg/dL — ABNORMAL HIGH (ref 70–99)

## 2011-06-25 LAB — URINALYSIS, ROUTINE W REFLEX MICROSCOPIC
Leukocytes, UA: NEGATIVE
Nitrite: NEGATIVE
Specific Gravity, Urine: 1.015 (ref 1.005–1.030)
pH: 6 (ref 5.0–8.0)

## 2011-06-25 NOTE — Progress Notes (Signed)
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 Codeine Hives  . Gabapentin Rash    Patient Measurements: Height: 5\' 5"  (165.1 cm) Weight: 210 lb 4.8 oz (95.391 kg) IBW/kg (Calculated) : 57    Vital Signs: Temp: 97.9 F (36.6 C) (04/06 1356) Temp src: Oral (04/06 1356) BP: 124/77 mmHg (04/06 1356) Pulse Rate: 96  (04/06 1356)  Intake/Output from previous day: 04/05 0701 - 04/06 0700 In: 1070 [P.O.:300; I.V.:220; IV Piggyback:550] Out: 700 [Urine:700]  Intake/Output from this shift: Total I/O In: 180 [P.O.:180] Out: -   Labs: Estimated Creatinine Clearance: 99.4 ml/min (by C-G formula based on Cr of 0.41). 06/20/11 Vancomycin trough = 10.8 mcg/ml 06/22/11 Vancomycin trough = 22 mcg/mL  Microbiology: Recent Results (from the past 720 hour(s))  GRAM STAIN     Status: Normal   Collection Time   06/15/11  7:04 PM      Component Value Range Status Comment   Specimen Description WOUND BACK   Final    Special Requests PATIENT ON FOLLOWING VANCOMYCIN LUMBAR WOUND   Final    Gram Stain     Final    Value: RARE WBC PRESENT, PREDOMINANTLY MONONUCLEAR     NO ORGANISMS SEEN   Report Status 06/15/2011 FINAL   Final   WOUND CULTURE     Status: Normal   Collection Time   06/15/11  7:04 PM      Component Value Range Status Comment   Specimen Description WOUND BACK   Final    Special Requests PATIENT ON FOLLOWING VANCOMYCIN LUMBAR WOUND   Final    Gram Stain     Final    Value: RARE WBC PRESENT, PREDOMINANTLY MONONUCLEAR     NO ORGANISMS SEEN     Performed at [facility]   Culture NO GROWTH 2 DAYS   Final    Report Status 06/18/2011 FINAL   Final   ANAEROBIC CULTURE     Status: Normal   Collection Time   06/15/11  7:04 PM      Component Value Range Status Comment   Specimen Description WOUND BACK   Final    Special Requests PATIENT ON FOLLOWING  VANCOMYCIN LUMBAR WOUND   Final    Gram Stain     Final    Value: RARE WBC PRESENT, PREDOMINANTLY MONONUCLEAR     NO ORGANISMS SEEN     Performed at [facility]   Culture NO ANAEROBES ISOLATED   Final    Report Status 06/20/2011 FINAL   Final   GRAM STAIN     Status: Normal   Collection Time   06/15/11  7:06 PM      Component Value Range Status Comment   Specimen Description TISSUE   Final    Special Requests PATIENT ON FOLLOWING VANCOMYCIN DISC SPACE   Final    Gram Stain     Final    Value: ABUNDANT WBC PRESENT,BOTH PMN AND MONONUCLEAR     NO ORGANISMS SEEN   Report Status 06/15/2011 FINAL   Final   TISSUE CULTURE     Status: Normal   Collection Time   06/15/11  7:06 PM      Component Value Range Status Comment   Specimen Description TISSUE   Final    Special Requests PATIENT ON FOLLOWING VANCOMYCIN DISC SPACE   Final    Gram Stain     Final  Value: ABUNDANT WBC PRESENT,BOTH PMN AND MONONUCLEAR     NO ORGANISMS SEEN     Performed at [facility]   Culture NO GROWTH 3 DAYS   Final    Report Status 06/19/2011 FINAL   Final   ANAEROBIC CULTURE     Status: Normal   Collection Time   06/15/11  7:06 PM      Component Value Range Status Comment   Specimen Description TISSUE   Final    Special Requests PATIENT ON FOLLOWING VANCOMYCIN DISC SPACE   Final    Gram Stain     Final    Value: ABUNDANT WBC PRESENT,BOTH PMN AND MONONUCLEAR     NO ORGANISMS SEEN     Performed at [facility]   Culture NO ANAEROBES ISOLATED   Final    Report Status 06/20/2011 FINAL   Final     Anti-infectives     Start     Dose/Rate Route Frequency Ordered Stop   06/23/11 0100   vancomycin (VANCOCIN) 2,000 mg in sodium chloride 0.9 % 500 mL IVPB        2,000 mg 250 mL/hr over 120 Minutes Intravenous Every 12 hours 06/22/11 2352     06/21/11 0400   vancomycin (VANCOCIN) 2,500 mg in sodium chloride 0.9 % 500 mL IVPB  Status:  Discontinued        2,500 mg 250 mL/hr over 120  Minutes Intravenous Every 12 hours 06/20/11 1448 06/20/11 1907   06/21/11 0130   vancomycin (VANCOCIN) 2,500 mg in sodium chloride 0.9 % 500 mL IVPB  Status:  Discontinued        2,500 mg 250 mL/hr over 120 Minutes Intravenous Every 12 hours 06/20/11 1907 06/22/11 2352   06/20/11 1500   vancomycin (VANCOCIN) IVPB 1000 mg/200 mL premix        1,000 mg 200 mL/hr over 60 Minutes Intravenous NOW 06/20/11 1446 06/20/11 1641   06/17/11 1400   vancomycin (VANCOCIN) 1,750 mg in sodium chloride 0.9 % 500 mL IVPB  Status:  Discontinued        1,750 mg 250 mL/hr over 120 Minutes Intravenous Every 12 hours 06/17/11 1125 06/20/11 1906   06/16/11 0400   ceFEPIme (MAXIPIME) 2 g in dextrose 5 % 50 mL IVPB        2 g 100 mL/hr over 30 Minutes Intravenous Every 12 hours 06/15/11 1749     06/14/11 2200   vancomycin (VANCOCIN) IVPB 1000 mg/200 mL premix  Status:  Discontinued        1,000 mg 200 mL/hr over 60 Minutes Intravenous Every 8 hours 06/14/11 1933 06/17/11 1124          Assessment: 46 yo female with L4 - 5 osteomyelitis/discitis/epidural abscess following laminectomy. Patient is on D12 vancomycin and D10 cefepime. Vancomycin regimen was adjusted earlier this week in response to a supratherapeutic trough.  Goal of Therapy:  Vancomycin trough level 15-20 mcg/ml  Plan:  1. Continue Vancomycin 2gm IV q12h 2. Recheck Vancomycin trough and BMET 4/7.  Estella Husk, Pharm.D., BCPS Clinical Pharmacist  Pager [phone] 06/25/2011, 2:54 PM

## 2011-06-25 NOTE — Progress Notes (Signed)
Patient ID: Julia [name], female   DOB: Apr 05, 1965, 46 y.o.   MRN: [id number] Subjective: Patient reports cont'd back and leg pain without N/T/W  Objective: Vital signs in last 24 hours: Temp:  [97.8 F (36.6 C)-98.9 F (37.2 C)] 97.8 F (36.6 C) (04/06 0865) Pulse Rate:  [91-109] 109  (04/06 0633) Resp:  [18-20] 20  (04/06 0633) BP: (135-150)/(78-90) 150/84 mmHg (04/06 0633) SpO2:  [96 %-99 %] 96 % (04/06 0633) FiO2 (%):  [21 %] 21 % (04/05 0857)  Intake/Output from previous day: 04/05 0701 - 04/06 0700 In: 1070 [P.O.:300; I.V.:220; IV Piggyback:550] Out: 700 [Urine:700] Intake/Output this shift:    Neurologic: Grossly normal Incision CDI  Lab Results: Lab Results  Component Value Date   WBC 6.0 06/17/2011   HGB 9.6* 06/17/2011   HCT 28.1* 06/17/2011   MCV 89.8 06/17/2011   PLT 125* 06/17/2011   No results found for this basename: INR, PROTIME   BMET Lab Results  Component Value Date   NA 138 06/17/2011   K 3.5 06/17/2011   CL 101 06/17/2011   CO2 29 06/17/2011   GLUCOSE 132* 06/17/2011   BUN 3* 06/17/2011   CREATININE 0.41* 06/17/2011   CALCIUM 9.0 06/17/2011    Studies/Results: No results found.  Assessment/Plan: Stable, continue abx per ID   LOS: 11 days    Julia [name] S 06/25/2011, 8:25 AM

## 2011-06-25 NOTE — Progress Notes (Signed)
Patient noted to have extremely foul smelling urine. Called MD on call and received order for a UA. Order placed and sample sent down. Will await results.    Minor, Julia [name]

## 2011-06-26 LAB — GLUCOSE, CAPILLARY
Glucose-Capillary: 101 mg/dL — ABNORMAL HIGH (ref 70–99)
Glucose-Capillary: 132 mg/dL — ABNORMAL HIGH (ref 70–99)
Glucose-Capillary: 102 mg/dL — ABNORMAL HIGH (ref 70–99)

## 2011-06-26 LAB — BASIC METABOLIC PANEL
CO2: 26 mEq/L (ref 19–32)
Calcium: 9.7 mg/dL (ref 8.4–10.5)
Creatinine, Ser: 0.48 mg/dL — ABNORMAL LOW (ref 0.50–1.10)
GFR calc Af Amer: >90 mL/min (ref >90)

## 2011-06-26 LAB — VANCOMYCIN, TROUGH: Vancomycin Tr: 15.6 ug/mL (ref 10.0–20.0)

## 2011-06-26 MED ORDER — ALTEPLASE 2 MG IJ SOLR: 2.0000 mg | Freq: Once | INTRAMUSCULAR | Status: DC

## 2011-06-26 MED ORDER — ALTEPLASE 2 MG IJ SOLR
2.0000 mg | Freq: Once | INTRAMUSCULAR | Status: AC
  Administered 2011-06-26: 2 mg
  Filled 2011-06-26: qty 2

## 2011-06-26 NOTE — Progress Notes (Signed)
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 Codeine Hives  . Gabapentin Rash    Patient Measurements: Height: 5\' 5"  (165.1 cm) Weight: 210 lb 4.8 oz (95.391 kg) IBW/kg (Calculated) : 57   Vital Signs:   Intake/Output from previous day: 04/06 0701 - 04/07 0700 In: 1070 [P.O.:520; IV Piggyback:550] Out: -  Intake/Output from this shift:    Labs:  Basename 06/26/11 1116  WBC --  HGB --  PLT --  LABCREA --  CREATININE 0.48*   Estimated Creatinine Clearance: 99.4 ml/min (by C-G formula based on Cr of 0.48).  Basename 06/26/11 1115  VANCOTROUGH 15.6  VANCOPEAK --  VANCORANDOM --  GENTTROUGH --  GENTPEAK --  GENTRANDOM --  TOBRATROUGH --  TOBRAPEAK --  TOBRARND --  AMIKACINPEAK --  AMIKACINTROU --  AMIKACIN --     Microbiology: Recent Results (from the past 720 hour(s))  GRAM STAIN     Status: Normal   Collection Time   06/15/11  7:04 PM      Component Value Range Status Comment   Specimen Description WOUND BACK   Final    Special Requests PATIENT ON FOLLOWING VANCOMYCIN LUMBAR WOUND   Final    Gram Stain     Final    Value: RARE WBC PRESENT, PREDOMINANTLY MONONUCLEAR     NO ORGANISMS SEEN   Report Status 06/15/2011 FINAL   Final   WOUND CULTURE     Status: Normal   Collection Time   06/15/11  7:04 PM      Component Value Range Status Comment   Specimen Description WOUND BACK   Final    Special Requests PATIENT ON FOLLOWING VANCOMYCIN LUMBAR WOUND   Final    Gram Stain     Final    Value: RARE WBC PRESENT, PREDOMINANTLY MONONUCLEAR     NO ORGANISMS SEEN     Performed at [facility]   Culture NO GROWTH 2 DAYS   Final    Report Status 06/18/2011 FINAL   Final   ANAEROBIC CULTURE     Status: Normal   Collection Time   06/15/11  7:04 PM      Component Value Range Status Comment   Specimen Description WOUND BACK   Final    Special  Requests PATIENT ON FOLLOWING VANCOMYCIN LUMBAR WOUND   Final    Gram Stain     Final    Value: RARE WBC PRESENT, PREDOMINANTLY MONONUCLEAR     NO ORGANISMS SEEN     Performed at [facility]   Culture NO ANAEROBES ISOLATED   Final    Report Status 06/20/2011 FINAL   Final   GRAM STAIN     Status: Normal   Collection Time   06/15/11  7:06 PM      Component Value Range Status Comment   Specimen Description TISSUE   Final    Special Requests PATIENT ON FOLLOWING VANCOMYCIN DISC SPACE   Final    Gram Stain     Final    Value: ABUNDANT WBC PRESENT,BOTH PMN AND MONONUCLEAR     NO ORGANISMS SEEN   Report Status 06/15/2011 FINAL   Final   TISSUE CULTURE     Status: Normal   Collection Time   06/15/11  7:06 PM      Component Value Range Status Comment   Specimen Description TISSUE   Final    Special Requests  PATIENT ON FOLLOWING VANCOMYCIN DISC SPACE   Final    Gram Stain     Final    Value: ABUNDANT WBC PRESENT,BOTH PMN AND MONONUCLEAR     NO ORGANISMS SEEN     Performed at [facility]   Culture NO GROWTH 3 DAYS   Final    Report Status 06/19/2011 FINAL   Final   ANAEROBIC CULTURE     Status: Normal   Collection Time   06/15/11  7:06 PM      Component Value Range Status Comment   Specimen Description TISSUE   Final    Special Requests PATIENT ON FOLLOWING VANCOMYCIN DISC SPACE   Final    Gram Stain     Final    Value: ABUNDANT WBC PRESENT,BOTH PMN AND MONONUCLEAR     NO ORGANISMS SEEN     Performed at [facility]   Culture NO ANAEROBES ISOLATED   Final    Report Status 06/20/2011 FINAL   Final     Anti-infectives     Start     Dose/Rate Route Frequency Ordered Stop   06/23/11 0100   vancomycin (VANCOCIN) 2,000 mg in sodium chloride 0.9 % 500 mL IVPB        2,000 mg 250 mL/hr over 120 Minutes Intravenous Every 12 hours 06/22/11 2352     06/21/11 0400   vancomycin (VANCOCIN) 2,500 mg in sodium chloride 0.9 % 500 mL IVPB  Status:  Discontinued        2,500  mg 250 mL/hr over 120 Minutes Intravenous Every 12 hours 06/20/11 1448 06/20/11 1907   06/21/11 0130   vancomycin (VANCOCIN) 2,500 mg in sodium chloride 0.9 % 500 mL IVPB  Status:  Discontinued        2,500 mg 250 mL/hr over 120 Minutes Intravenous Every 12 hours 06/20/11 1907 06/22/11 2352   06/20/11 1500   vancomycin (VANCOCIN) IVPB 1000 mg/200 mL premix        1,000 mg 200 mL/hr over 60 Minutes Intravenous NOW 06/20/11 1446 06/20/11 1641   06/17/11 1400   vancomycin (VANCOCIN) 1,750 mg in sodium chloride 0.9 % 500 mL IVPB  Status:  Discontinued        1,750 mg 250 mL/hr over 120 Minutes Intravenous Every 12 hours 06/17/11 1125 06/20/11 1906   06/16/11 0400   ceFEPIme (MAXIPIME) 2 g in dextrose 5 % 50 mL IVPB        2 g 100 mL/hr over 30 Minutes Intravenous Every 12 hours 06/15/11 1749     06/14/11 2200   vancomycin (VANCOCIN) IVPB 1000 mg/200 mL premix  Status:  Discontinued        1,000 mg 200 mL/hr over 60 Minutes Intravenous Every 8 hours 06/14/11 1933 06/17/11 1124          Assessment: 46 yo female with 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 Patient is on D13 vancomycin and D11 cefepime. Vancomycin trough is within the therapeutic range.  Goal of Therapy:  Vancomycin trough level 15-20 mcg/ml  Plan:  1. Continue Vancomycin 2gm IV q12h 2. Will monitor Vancomycin trough at least weekly and BMET twice weekly.   Estella Husk, Pharm.D., BCPS Clinical Pharmacist  Pager [phone] 06/26/2011, 1:24 PM

## 2011-06-26 NOTE — Progress Notes (Signed)
Overall stable. No new complaints, still has moderate back and lower extremity pain.  Afebrile. Vital stable. Neurologic exam unchanged. Wound healing well. Staples still in place.  The antibiotics. Possible SNIF placement tomorrow.

## 2011-06-26 NOTE — Progress Notes (Signed)
No blood return from either port of RT DL PICC.    Flushes easily both ports.   TPA ordered per protocol.  Lab to draw Vanco and BMP.    I called lab and made them aware.  Primary RN made aware also.

## 2011-06-27 LAB — GLUCOSE, CAPILLARY

## 2011-06-27 MED ORDER — METFORMIN HCL 500 MG PO TABS: 500.0000 mg | ORAL_TABLET | Freq: Two times a day (BID) | ORAL | Status: DC

## 2011-06-27 MED ORDER — HEPARIN SOD (PORK) LOCK FLUSH 100 UNIT/ML IV SOLN
250.0000 [IU] | INTRAVENOUS | Status: AC | PRN
  Administered 2011-06-27: 250 [IU]

## 2011-06-27 MED ORDER — INSULIN ASPART 100 UNIT/ML ~~LOC~~ SOLN: 0.0000 [IU] | Freq: Every day | SUBCUTANEOUS | Status: DC

## 2011-06-27 MED ORDER — VANCOMYCIN HCL 1000 MG IV SOLR: 2000.0000 mg | Freq: Two times a day (BID) | INTRAVENOUS | Status: DC

## 2011-06-27 MED ORDER — DSS 100 MG PO CAPS: 100.0000 mg | ORAL_CAPSULE | Freq: Two times a day (BID) | ORAL | Status: AC

## 2011-06-27 MED ORDER — DEXTROSE 5 % IV SOLN: 2.0000 g | Freq: Two times a day (BID) | INTRAVENOUS | Status: DC

## 2011-06-27 NOTE — Progress Notes (Signed)
Patient ID: Julia [name], female   DOB: 08-08-1965, 46 y.o.   MRN: [id number] Before she is discharge we will get hold of internal medicine and ID to be sure of her home medications. i will give her oxycodone and diazepam staples wim be removed prior to dc

## 2011-06-27 NOTE — Progress Notes (Signed)
Agree with student PT cancellation note.  Tanaia Hawkey, PT DPT [phone]  

## 2011-06-27 NOTE — Progress Notes (Signed)
Wound dry. C/o lbp . No weakness. Id note read. Discharge today

## 2011-06-27 NOTE — Discharge Summary (Signed)
Physician Discharge Summary  Patient ID: Julia [name] MRN: [id number] DOB/AGE: 1965/03/23 46 y.o.  Admit date: 06/14/2011 Discharge date: 06/27/2011  Admission Diagnoses:l4-4 lumbar discitis. Stenosis. dm  Discharge Diagnoses:  Active Problems:  Diabetes mellitus  Increased wound drainage  COPD (chronic obstructive pulmonary disease)  HTN (hypertension)  Bipolar affective disorder   Discharged Condition: stable. afebril  Hospital Course: underwent l45 laminectomies to decompress the canal. Specimen taken and send to the lab  Consults:internal medicine. ID  Significant Diagnostic Studies:MRI  Treatments: SURGICAL DECOMPRESSION AND IV ABS  Discharge Exam: Blood pressure 148/101, pulse 105, temperature 98.2 F (36.8 C), temperature source Oral, resp. rate 20, height 5\' 5"  (1.651 m), weight 95.391 kg (210 lb 4.8 oz), last menstrual period 05/09/2011, SpO2 98.00%..  Wound dry. No drainage.  Disposition: nursing home in [facility]  Discharge Orders    Future Appointments: Provider: Department: Dept Phone: Center:   08/02/2011 9:00 AM Judyann Munson, MD [facility] [phone] [facility]     Medication List  As of 06/27/2011 10:34 AM   ASK your doctor about these medications         amLODipine 10 MG tablet   Commonly known as: NORVASC   Take 10 mg by mouth daily.      carisoprodol 350 MG tablet   Commonly known as: SOMA   Take 350 mg by mouth 2 (two) times daily as needed. For pain.      ibuprofen 200 MG tablet   Commonly known as: ADVIL,MOTRIN   Take 200 mg by mouth every 6 (six) hours as needed. For pain.      insulin NPH-insulin regular (70-30) 100 UNIT/ML injection   Commonly known as: NOVOLIN 70/30   Inject 30-50 Units into the skin 2 (two) times daily with a meal. 50 units in the morning; 30 units in the evening      lisinopril-hydrochlorothiazide 10-12.5 MG per tablet   Commonly known as: PRINZIDE,ZESTORETIC   Take 1 tablet by mouth daily.      oxyCODONE 15  MG immediate release tablet   Commonly known as: ROXICODONE   Take 15 mg by mouth every 4 (four) hours as needed. For pain      risperiDONE 1 MG tablet   Commonly known as: RISPERDAL   Take 1-2 mg by mouth 2 (two) times daily. 1mg  in the morning; 2mg  in the evening      tiotropium 18 MCG inhalation capsule   Commonly known as: SPIRIVA   Place 18 mcg into inhaler and inhale daily.           Patient to continue iv abs as per ID. i will see her ion my office in 4 to 6 weeks or before prn  Signed: Jazzmyne Rasnick M 06/27/2011, 10:34 AM

## 2011-06-27 NOTE — Progress Notes (Signed)
PT Cancellation Note  Treatment cancelled today due to patient's refusal to participate.  Pt states that she has 9/10 pain in her back and legs.  Pt states that she is going to "rehab" (patient being discharged to SNF) today and does not want to participate PT.  Ezzard Standing SPT 06/27/2011, 11:15 AM

## 2011-06-27 NOTE — Progress Notes (Signed)
Clinical Social Work Department CLINICAL SOCIAL WORK PLACEMENT NOTE 06/27/2011  Patient:  Julia [name], Julia [name]  Account Number:  [id number] Admit date:  06/14/2011  Clinical Social Worker:  Peggyann Shoals  Date/time:  06/27/2011 05:00 PM  Clinical Social Work is seeking post-discharge placement for this patient at the following level of care:   SKILLED NURSING   (*CSW will update this form in Epic as items are completed)   06/22/2011  Patient/family provided with [facility] Department of Clinical Social Work's list of facilities offering this level of care within the geographic area requested by the patient (or if unable, by the patient's family).  06/22/2011  Patient/family informed of their freedom to choose among providers that offer the needed level of care, that participate in Medicare, Medicaid or managed care program needed by the patient, have an available bed and are willing to accept the patient.  06/22/2011  Patient/family informed of [facility]' ownership interest in [facility], as well as of the fact that they are under no obligation to receive care at this facility.  PASARR submitted to EDS on 06/23/2011 PASARR number received from [employer]DS on 06/24/2011  FL2 transmitted to all facilities in geographic area requested by pt/family on  06/22/2011 FL2 transmitted to all facilities within larger geographic area on   Patient informed that his/her managed care company has contracts with or will negotiate with  certain facilities, including the following:     Patient/family informed of bed offers received:  06/24/2011 Patient chooses bed at OTHER Physician recommends and patient chooses bed at  OTHER  Patient to be transferred to OTHER on  06/27/2011 Patient to be transferred to facility by [facility]  The following physician request were entered in Epic:   Additional Comments: Pt choose and discharged to [facility].  Dede Query, MSW,  [facility] [phone]

## 2011-06-27 NOTE — Progress Notes (Signed)
Pt is ready for discharge to [facility]. Facility has received discharge summary and is ready to accept pt. [facility] will provide transportation. Pt and family are agreeable to discharge plan. CSW is signing off as no further needs identified.   Dede Query, MSW, [facility] [phone]

## 2011-06-28 ENCOUNTER — Telehealth: Payer: Self-pay | Admitting: *Deleted

## 2011-06-28 NOTE — Telephone Encounter (Signed)
I gave her Dr. Feliz Beam number as we have not yet seen her here & she has questions about the Vanco

## 2011-08-02 ENCOUNTER — Ambulatory Visit
Admission: RE | Admit: 2011-08-02 | Discharge: 2011-08-02 | Disposition: A | Discharge status: Home / Self Care | Payer: Medicaid Other | Source: Ambulatory Visit | Attending: Internal Medicine | Admitting: Internal Medicine

## 2011-08-02 ENCOUNTER — Ambulatory Visit (INDEPENDENT_AMBULATORY_CARE_PROVIDER_SITE_OTHER): Payer: Medicaid Other | Admitting: Internal Medicine

## 2011-08-02 VITALS — BP 167/106 | HR 123 | Temp 97.6°F | Wt 206.5 lb

## 2011-08-02 DIAGNOSIS — M79662 Pain in left lower leg: Secondary | ICD-10-CM

## 2011-08-02 DIAGNOSIS — M519 Unspecified thoracic, thoracolumbar and lumbosacral intervertebral disc disorder: Secondary | ICD-10-CM

## 2011-08-02 DIAGNOSIS — M79609 Pain in unspecified limb: Secondary | ICD-10-CM

## 2011-08-02 DIAGNOSIS — M4640 Discitis, unspecified, site unspecified: Secondary | ICD-10-CM

## 2011-08-02 LAB — COMPREHENSIVE METABOLIC PANEL
BUN: 4 mg/dL — ABNORMAL LOW (ref 6–23)
CO2: 24 mEq/L (ref 19–32)
Calcium: 10 mg/dL (ref 8.4–10.5)
Chloride: 102 mEq/L (ref 96–112)
Creat: 0.54 mg/dL (ref 0.50–1.10)

## 2011-08-02 LAB — C-REACTIVE PROTEIN: CRP: 0.21 mg/dL (ref <0.60)

## 2011-08-02 LAB — CBC WITH DIFFERENTIAL/PLATELET
Eosinophils Relative: 2 % (ref 0–5)
HCT: 41.2 % (ref 36.0–46.0)
Lymphocytes Relative: 33 % (ref 12–46)
Lymphs Abs: 2.3 10*3/uL (ref 0.7–4.0)
MCH: 26.1 pg (ref 26.0–34.0)
MCV: 84.1 fL (ref 78.0–100.0)
Monocytes Absolute: 0.9 10*3/uL (ref 0.1–1.0)
RDW: 13.7 % (ref 11.5–15.5)
WBC: 7 10*3/uL (ref 4.0–10.5)

## 2011-08-02 LAB — SEDIMENTATION RATE: Sed Rate: 4 mm/hr (ref 0–22)

## 2011-08-16 ENCOUNTER — Encounter: Payer: Self-pay | Admitting: Infectious Disease

## 2011-08-16 ENCOUNTER — Ambulatory Visit (INDEPENDENT_AMBULATORY_CARE_PROVIDER_SITE_OTHER): Payer: Medicaid Other | Admitting: Infectious Disease

## 2011-08-16 VITALS — BP 150/105 | HR 118 | Temp 98.0°F | Ht 65.0 in | Wt 205.0 lb

## 2011-08-16 DIAGNOSIS — M4640 Discitis, unspecified, site unspecified: Secondary | ICD-10-CM | POA: Insufficient documentation

## 2011-08-16 DIAGNOSIS — I10 Essential (primary) hypertension: Secondary | ICD-10-CM

## 2011-08-16 DIAGNOSIS — M519 Unspecified thoracic, thoracolumbar and lumbosacral intervertebral disc disorder: Secondary | ICD-10-CM

## 2011-08-16 MED ORDER — SULFAMETHOXAZOLE-TRIMETHOPRIM 800-160 MG PO TABS: 1.0000 | ORAL_TABLET | Freq: Two times a day (BID) | ORAL | Status: DC

## 2011-08-16 NOTE — Assessment & Plan Note (Signed)
Blood pressure elevated in clinic today will defer to primary care physician for further management.

## 2011-08-16 NOTE — Progress Notes (Signed)
RN received verbal order to discontinue the patient's PICC line.  Patient identified with name and date of birth. PICC dressing removed, site unremarkable.  PICC line removed using sterile procedure @ 1105. PICC length equal to that noted in patient's hospital chart of 46 cm. Sterile petroleum gauze + sterile 4X4 applied to PICC site, pressure applied for 10 minutes and covered with Medipore tape as a pressure dressing. Patient tolerated procedure without complaints.  Patient instructed to limit use of arm for 1 hour. Patient instructed that the pressure dressing should remain in place for 24 hours. Patient verbalized understanding of these instructions.

## 2011-08-16 NOTE — Assessment & Plan Note (Signed)
Pole PICC line in place on Bactrim double strength tablet one tablet twice daily return to clinic in 2 months time with me.

## 2011-08-16 NOTE — Progress Notes (Signed)
Subjective:    Patient ID: Julia [name], female    DOB: [birth year], 46 y.o.   MRN: [id number]  HPI  46 yo Julia [name] is an 46 y.o. female. With cirrhosis, DM, who underwent on January 30 of this year  underwent left 4-5 diskectomy because of herniated disk. She came back in on May 08, 2011 with worsening of pain, the left worse than the right one. She was taken to surgery by Dr. Phoebe Perch  L4-5 diskectomy was done. Cultures were taken at that time which failed to grow any organism. She went home and was having copious drainage from wound. She was seen in Office by Dr. Jeral Fruit who cultured the wound on the Wound from January 26th and placed the pt on cipro. Moderate Enterobacter R to ancef and cefoxitin, S to ceftriaxone, ceftaz, zosyn imipenem, cipro, tmpsmx, tobra, gent. She was taken to or and Dr. Jeral Fruit did I and D of lumbar wound debriding it down to near the bone. I saw the patient and recommended 4 weeks of IV rocephin minimum. Pt completed this regimen but then had recurrence of symptoms on on 03/26 with fever and back pain. A lumbar MRI revealed a large phlegmon at the L4/L5 level consistent with osteomyelitis and epidural and paraspinal abscesses. The patient was empirically switched to vancomycin and underwent a repeat L4 and L5 laminectomy and I and D by Dr Jeral Fruit. Wound cultures are sent to the lab failed to grow an organism. She has been on vancomycin and cefepime since then. She was seen on May 14 by Dr. Ilsa Iha and labs were checked including a sedimentation rate which was normal. Returns to clinic today for followup. She states her back pain is dramatically improved compared to when she was in the hospital. She rates it as being a 4/5 at of 10. It is worse at night when she sleeps. She has no fevers nausea or other systemic symptoms. I spent greater than 45 minutes with the patient including greater than 50% of time in face to face counsel of the patient and in coordination of their  care.   [facility]   Review of Systems  Constitutional: Negative for fever, chills, diaphoresis, activity change, appetite change, fatigue and unexpected weight change.  HENT: Negative for congestion, sore throat, rhinorrhea, sneezing, trouble swallowing and sinus pressure.   Eyes: Negative for photophobia and visual disturbance.  Respiratory: Negative for cough, chest tightness, shortness of breath, wheezing and stridor.   Cardiovascular: Negative for chest pain, palpitations and leg swelling.  Gastrointestinal: Negative for nausea, vomiting, abdominal pain, diarrhea, constipation, blood in stool, abdominal distention and anal bleeding.  Genitourinary: Negative for dysuria, hematuria, flank pain and difficulty urinating.  Musculoskeletal: Positive for back pain. Negative for myalgias, joint swelling, arthralgias and gait problem.  Skin: Negative for color change, pallor, rash and wound.  Neurological: Negative for dizziness, tremors, weakness and light-headedness.  Hematological: Negative for adenopathy. Does not bruise/bleed easily.  Psychiatric/Behavioral: Negative for behavioral problems, confusion, sleep disturbance, dysphoric mood, decreased concentration and agitation.       Objective:   Physical Exam  Constitutional: She is oriented to person, place, and time. She appears well-developed and well-nourished. No distress.  HENT:  Head: Normocephalic and atraumatic.  Mouth/Throat: Oropharynx is clear and moist. No oropharyngeal exudate.  Eyes: Conjunctivae and EOM are normal. Pupils are equal, round, and reactive to light. No scleral icterus.  Neck: Normal range of motion. Neck supple. No JVD present.  Cardiovascular:  Normal rate, regular rhythm and normal heart sounds.  Exam reveals no gallop and no friction rub.   No murmur heard. Pulmonary/Chest: Effort normal and breath sounds normal. No respiratory distress. She has no wheezes. She has no rales. She  exhibits no tenderness.  Abdominal: She exhibits no distension and no mass. There is no tenderness. There is no rebound and no guarding.  Musculoskeletal: She exhibits no edema and no tenderness.       Arms: Lymphadenopathy:    She has no cervical adenopathy.  Neurological: She is alert and oriented to person, place, and time. She has normal reflexes. She exhibits normal muscle tone. Coordination normal.  Skin: Skin is warm and dry. She is not diaphoretic. No erythema. No pallor.  Psychiatric: She has a normal mood and affect. Her behavior is normal. Judgment and thought content normal.          Assessment & Plan:  Diskitis Pole PICC line in place on Bactrim double strength tablet one tablet twice daily return to clinic in 2 months time with me.  HTN (hypertension) Blood pressure elevated in clinic today will defer to primary care physician for further management.

## 2011-11-02 ENCOUNTER — Ambulatory Visit: Payer: Medicaid Other | Admitting: Infectious Disease

## 2011-12-07 ENCOUNTER — Ambulatory Visit (INDEPENDENT_AMBULATORY_CARE_PROVIDER_SITE_OTHER): Payer: Medicaid Other | Admitting: Infectious Disease

## 2011-12-07 ENCOUNTER — Encounter: Payer: Self-pay | Admitting: Infectious Disease

## 2011-12-07 VITALS — BP 143/103 | HR 98 | Temp 97.3°F | Wt 193.5 lb

## 2011-12-07 DIAGNOSIS — M4640 Discitis, unspecified, site unspecified: Secondary | ICD-10-CM

## 2011-12-07 DIAGNOSIS — M519 Unspecified thoracic, thoracolumbar and lumbosacral intervertebral disc disorder: Secondary | ICD-10-CM

## 2011-12-07 MED ORDER — SULFAMETHOXAZOLE-TRIMETHOPRIM 800-160 MG PO TABS: 1.0000 | ORAL_TABLET | Freq: Two times a day (BID) | ORAL | Status: DC

## 2011-12-07 NOTE — Assessment & Plan Note (Signed)
Recheck inflammatory markers. I have rewritten prescription for Bactrim like her to stay on this for at least a year postoperatively.

## 2011-12-07 NOTE — Progress Notes (Signed)
Subjective:    Patient ID: Julia [name], female    DOB: 12-Mar-1966, 46 y.o.   MRN: [id number]  HPI  46yo [name] is an 46 y.o. female. With cirrhosis, DM, who underwent on January 30 of this year  underwent left 4-5 diskectomy because of herniated disk. She came back in on May 08, 2011 with worsening of pain, the left worse than the right one. She was taken to surgery by Dr. Phoebe Perch L4-5 diskectomy was done. Cultures were taken at that time which failed to grow any organism. She went home and was having copious drainage from wound. She was seen in Office by Dr. Jeral Fruit who cultured the wound on the Wound from January 26th and placed the pt on cipro. Moderate Enterobacter R to ancef and cefoxitin, S to ceftriaxone, ceftaz, zosyn imipenem, cipro, tmpsmx, tobra, gent. She was taken to or and Dr. Jeral Fruit did I and D of lumbar wound debriding it down to near the bone. I saw the patient and recommended 4 weeks of IV rocephin minimum. Pt completed this regimen but then had recurrence of symptoms on on 03/26 with fever and back pain. A lumbar MRI revealed a large phlegmon at the L4/L5 level consistent with osteomyelitis and epidural and paraspinal abscesses. The patient was empirically switched to vancomycin and underwent a repeat L4 and L5 laminectomy and I and D by Dr Jeral Fruit. Wound cultures are sent to the lab failed to grow an organism. She has been on vancomycin and cefepime since then. She was seen on May 14 by Dr. Ilsa Iha and labs were checked including a sedimentation rate which was normal. She states that she stopped taking her Bactrim approximately a month ago because it supposedly ran out. Another prescription I wrote for 11 refills is certainly should not happen. She also asked for narcotics for her pain in her legs. He states her back pain is dramatically better than it was when she had her active infection she is without fevers chills nausea or malaise.    Review of Systems  Constitutional:  Negative for fever, chills, diaphoresis, activity change, appetite change, fatigue and unexpected weight change.  HENT: Negative for congestion, sore throat, rhinorrhea, sneezing, trouble swallowing and sinus pressure.   Eyes: Negative for photophobia and visual disturbance.  Respiratory: Negative for cough, chest tightness, shortness of breath, wheezing and stridor.   Cardiovascular: Negative for chest pain, palpitations and leg swelling.  Gastrointestinal: Negative for nausea, vomiting, abdominal pain, diarrhea, constipation, blood in stool, abdominal distention and anal bleeding.  Genitourinary: Negative for dysuria, hematuria, flank pain and difficulty urinating.  Musculoskeletal: Positive for back pain. Negative for myalgias, joint swelling, arthralgias and gait problem.  Skin: Negative for color change, pallor, rash and wound.  Neurological: Negative for dizziness, tremors, weakness and light-headedness.  Hematological: Negative for adenopathy. Does not bruise/bleed easily.  Psychiatric/Behavioral: Negative for behavioral problems, confusion, disturbed wake/sleep cycle, dysphoric mood, decreased concentration and agitation.       Objective:   Physical Exam  Constitutional: She is oriented to person, place, and time. She appears well-developed and well-nourished. No distress.  HENT:  Head: Normocephalic and atraumatic.  Mouth/Throat: Oropharynx is clear and moist. No oropharyngeal exudate.  Eyes: Conjunctivae normal and EOM are normal. Pupils are equal, round, and reactive to light. No scleral icterus.  Neck: Normal range of motion. Neck supple. No JVD present.  Cardiovascular: Normal rate, regular rhythm and normal heart sounds.  Exam reveals no gallop and no friction rub.  No murmur heard. Pulmonary/Chest: Effort normal and breath sounds normal. No respiratory distress. She has no wheezes. She has no rales. She exhibits no tenderness.  Abdominal: She exhibits no distension and no  mass. There is no tenderness. There is no rebound and no guarding.  Musculoskeletal: She exhibits no edema and no tenderness.       Arms: Lymphadenopathy:    She has no cervical adenopathy.  Neurological: She is alert and oriented to person, place, and time. She has normal reflexes. She exhibits normal muscle tone. Coordination normal.  Skin: Skin is warm and dry. She is not diaphoretic. No erythema. No pallor.  Psychiatric: She has a normal mood and affect. Her behavior is normal. Judgment and thought content normal.       Speech is at times difficult to understand she appears to be a bit sedated from her pain medicines.          Assessment & Plan:  Diskitis Recheck inflammatory markers. I have rewritten prescription for Bactrim like her to stay on this for at least a year postoperatively.

## 2011-12-08 ENCOUNTER — Telehealth: Payer: Self-pay | Admitting: Licensed Clinical Social Worker

## 2011-12-08 NOTE — Telephone Encounter (Signed)
I left a message with the patient's mother for her to come in to do labs that she didn't do at her visit on Monday 12/07/2011. Her orders are in Epic and the patient needs to make a lab appointment.

## 2011-12-15 ENCOUNTER — Other Ambulatory Visit: Payer: Medicaid Other

## 2011-12-15 ENCOUNTER — Other Ambulatory Visit: Payer: Self-pay | Admitting: Infectious Disease

## 2011-12-15 DIAGNOSIS — M4640 Discitis, unspecified, site unspecified: Secondary | ICD-10-CM

## 2011-12-15 LAB — CBC WITH DIFFERENTIAL/PLATELET
Basophils Relative: 1 % (ref 0–1)
HCT: 47 % — ABNORMAL HIGH (ref 36.0–46.0)
Hemoglobin: 15.9 g/dL — ABNORMAL HIGH (ref 12.0–15.0)
Lymphs Abs: 5 10*3/uL — ABNORMAL HIGH (ref 0.7–4.0)
MCH: 28.3 pg (ref 26.0–34.0)
MCHC: 33.8 g/dL (ref 30.0–36.0)
Monocytes Absolute: 1.5 10*3/uL — ABNORMAL HIGH (ref 0.1–1.0)
Monocytes Relative: 10 % (ref 3–12)
Neutro Abs: 9 10*3/uL — ABNORMAL HIGH (ref 1.7–7.7)

## 2011-12-16 LAB — COMPREHENSIVE METABOLIC PANEL
Albumin: 4.3 g/dL (ref 3.5–5.2)
Alkaline Phosphatase: 106 U/L (ref 39–117)
BUN: 13 mg/dL (ref 6–23)
CO2: 24 mEq/L (ref 19–32)
Calcium: 10 mg/dL (ref 8.4–10.5)
Glucose, Bld: 103 mg/dL — ABNORMAL HIGH (ref 70–99)
Potassium: 4.3 mEq/L (ref 3.5–5.3)

## 2011-12-16 LAB — SEDIMENTATION RATE: Sed Rate: 1 mm/hr (ref 0–22)

## 2011-12-16 LAB — C-REACTIVE PROTEIN: CRP: <0.5 mg/dL (ref <0.60)

## 2011-12-29 NOTE — Progress Notes (Signed)
INFECTIOUS DISEASES CLINIC  RFV: hospital follow up for L4L-5 endplate diskitis Subjective:    Patient ID: Julia [name], female    DOB: 1965/11/15, 46 y.o.   MRN: [id number]  HPI Julia [name] is a 46yo F with DM, COPD, HTN, underwent L4-5 diskectomy on feb 12, complicated by wound dehiscence. Wound cultures grew out enterobacter s/p I x D and was treated with 4 wks of CTX but had L4-L% phlegmon and osteomyelitis as well as epidural and paraspinal abscess, thus she was underwent 2nd I x D and repeat L4-5 laminectomy but cultures were negative at this time, her antibiotic regimen was changed to vancomycin and cefepime. Cultures negative due to being on antibiotics. ESR recently elevated at 80 and CRP of 2.71. Since being released from the hospital she states that she is doing well at rehab. No fever, chills, nightsweats, back pain improved, managed with fentanyl patch and oxycodone PRN. Still some weakness in left lower leg. No difficulty with picc line, although had occ bleeding from picc site. She states that she has occasional vaginal yeast infection for which they have been giving her nystatin.  Current Outpatient Prescriptions on File Prior to Visit  Medication Sig Dispense Refill  . amLODipine (NORVASC) 10 MG tablet Take 10 mg by mouth daily.      Marland Kitchen dextrose 5 % SOLN 50 mL with ceFEPIme 2 G SOLR 2 g Inject 2 g into the vein every 12 (twelve) hours.  96 vial  0  . metFORMIN (GLUCOPHAGE) 500 MG tablet Take 1 tablet (500 mg total) by mouth 2 (two) times daily with a meal.      . oxyCODONE (ROXICODONE) 15 MG immediate release tablet Take 15 mg by mouth every 4 (four) hours as needed. For pain      . risperiDONE (RISPERDAL) 1 MG tablet Take 1-2 mg by mouth 2 (two) times daily. 1mg  in the morning; 2mg  in the evening      . sodium chloride 0.9 % SOLN 100 mL with vancomycin 1000 MG SOLR 2,000 mg Inject 2,000 mg into the vein every 12 (twelve) hours.  96 vial  0  . tiotropium (SPIRIVA) 18 MCG inhalation capsule  Place 18 mcg into inhaler and inhale daily.      Marland Kitchen sulfamethoxazole-trimethoprim (BACTRIM DS) 800-160 MG per tablet Take 1 tablet by mouth 2 (two) times daily.  60 tablet  11   Active Ambulatory Problems    Diagnosis Date Noted  . Diabetes mellitus 05/20/2011  . Cough 05/20/2011  . Increased wound drainage 05/20/2011  . COPD (chronic obstructive pulmonary disease) 05/20/2011  . HTN (hypertension) 05/20/2011  . Bipolar affective disorder 05/20/2011  . S/P diskectomy 05/20/2011  . Diskitis 08/16/2011   Resolved Ambulatory Problems    Diagnosis Date Noted  . No Resolved Ambulatory Problems   Past Medical History  Diagnosis Date  . Hypertension   . Diabetes mellitus   . Asthma   . Shortness of breath   . Headache   . Arthritis   . Fibromyalgia   . Cirrhosis of liver   . Anxiety   . Cirrhosis of liver   . Bipolar 1 disorder         Review of Systems 10 point ROS is negative. Other than recent bleeding from picc line site, and vaginal yeast infection    Objective:   Physical Exam  BP 167/106  Pulse 123  Temp 97.6 F (36.4 C) (Oral)  Wt 206 lb 8 oz (93.668 kg) Physical Exam  Constitutional:  oriented to person, place, and time.  appears well-developed and well-nourished. No distress.  HENT:  Mouth/Throat: Oropharynx is clear and moist. No oropharyngeal exudate.  Cardiovascular: Normal rate, regular rhythm and normal heart sounds. Exam reveals no gallop and no friction rub.  No murmur heard.  Pulmonary/Chest: Effort normal and breath sounds normal. No respiratory distress. He has no wheezes.  Back= surgical scar well healed Lymphadenopathy:  no cervical adenopathy.  Neurological: left leg 4+/5 and right leg 5/5 strength Skin: Skin is warm and dry. No rash noted. No erythema.         Assessment & Plan:  Lumbar osteomyelitis/diskitis = nearly complete with vancomycin and cefepime. Antibiotic end date is 08/14/2011. Will recheck ESR and CRP at this time to decide  if she needs oral suppressive therapy. She is scheduled to see Dr. Daiva Eves < 2 wks.  rtc in 2 wk

## 2012-01-17 ENCOUNTER — Telehealth: Payer: Self-pay | Admitting: Licensed Clinical Social Worker

## 2012-01-17 NOTE — Telephone Encounter (Signed)
Patient called stating that she went to the ER on Friday and again on Sunday with headache and numbness in her hand. She returned on Sunday because her symptoms worsened, and now her back is hurting and she wants to know if this could be the diskitis coming back. Please advise

## 2012-01-17 NOTE — Telephone Encounter (Signed)
She is supposed to be taking bactrim orally to suppress possible infection. Infection was in her lumbar area so her ssx she describes are NOT likely related to her lumbar infection. She never appears to have had her esr and crp checked When is her next appt?

## 2012-01-18 NOTE — Telephone Encounter (Signed)
Perfect

## 2012-01-18 NOTE — Telephone Encounter (Signed)
I spoke with the patient and let her know that her sxs did not appear to be from her lumbar infection per Dr. Daiva Eves. I advised her to call if she develops lower back pain, or fever.

## 2012-01-18 NOTE — Telephone Encounter (Signed)
I didn't see an appointment scheduled, would you like her to make an appointment to see you?

## 2012-01-18 NOTE — Telephone Encounter (Signed)
Either myself--but I have zero availability till January or ID clinic md

## 2012-10-06 IMAGING — CR DG LUMBAR SPINE 1V
1 series · 1 of 1 positions shown · non-contrast
Comparison: MRI of the lumbar spine performed earlier today at
[DATE] p.m.

CLINICAL DATA: Intraoperative radiograph during L4-L5 laminectomy.

LUMBAR SPINE - 1 VIEW

[view not recorded]
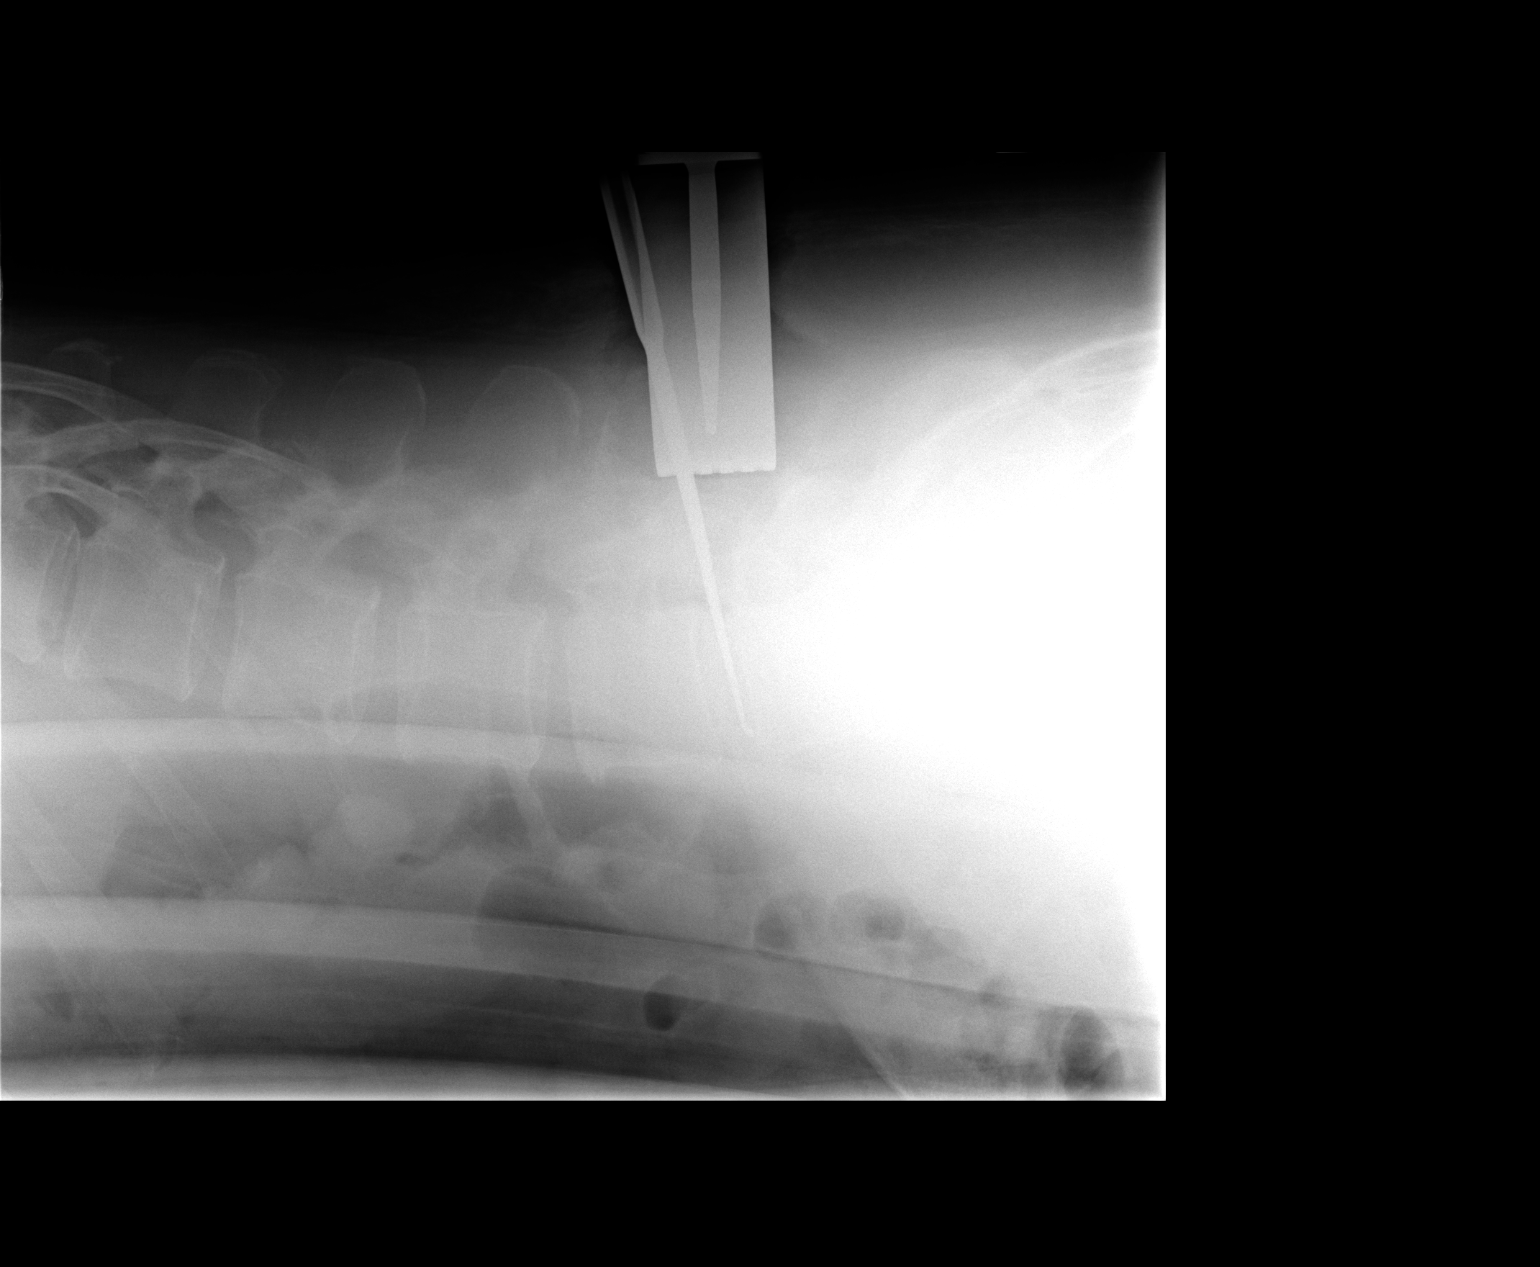

[1 of 1 positions shown; findings below may reference images not displayed]

FINDINGS: A single cross-table lateral view of the lumbar spine
demonstrates spinal instrumentation extending overlying the L4-L5
intervertebral disc space, with associated spreaders posteriorly at
this level.  No significant change is seen in comparison to the
recent prior MRI.
IMPRESSION: Spinal instrumentation extends overlying the L4-L5 intervertebral
disc space.

## 2012-12-17 ENCOUNTER — Other Ambulatory Visit: Payer: Self-pay | Admitting: Infectious Disease

## 2013-01-21 ENCOUNTER — Encounter (HOSPITAL_COMMUNITY): Payer: Self-pay | Admitting: Emergency Medicine

## 2013-01-21 ENCOUNTER — Emergency Department (HOSPITAL_COMMUNITY)
Admission: EM | Admit: 2013-01-21 | Discharge: 2013-01-21 | Disposition: A | Discharge status: Home / Self Care | Payer: Medicaid Other | Attending: Emergency Medicine | Admitting: Emergency Medicine

## 2013-01-21 ENCOUNTER — Emergency Department (HOSPITAL_COMMUNITY): Payer: Medicaid Other

## 2013-01-21 DIAGNOSIS — F411 Generalized anxiety disorder: Secondary | ICD-10-CM | POA: Insufficient documentation

## 2013-01-21 DIAGNOSIS — M129 Arthropathy, unspecified: Secondary | ICD-10-CM | POA: Insufficient documentation

## 2013-01-21 DIAGNOSIS — IMO0001 Reserved for inherently not codable concepts without codable children: Secondary | ICD-10-CM | POA: Insufficient documentation

## 2013-01-21 DIAGNOSIS — F17200 Nicotine dependence, unspecified, uncomplicated: Secondary | ICD-10-CM | POA: Insufficient documentation

## 2013-01-21 DIAGNOSIS — T07XXXA Unspecified multiple injuries, initial encounter: Secondary | ICD-10-CM

## 2013-01-21 DIAGNOSIS — R0989 Other specified symptoms and signs involving the circulatory and respiratory systems: Secondary | ICD-10-CM | POA: Insufficient documentation

## 2013-01-21 DIAGNOSIS — I10 Essential (primary) hypertension: Secondary | ICD-10-CM | POA: Insufficient documentation

## 2013-01-21 DIAGNOSIS — S0003XA Contusion of scalp, initial encounter: Secondary | ICD-10-CM | POA: Insufficient documentation

## 2013-01-21 DIAGNOSIS — S40019A Contusion of unspecified shoulder, initial encounter: Secondary | ICD-10-CM | POA: Insufficient documentation

## 2013-01-21 DIAGNOSIS — R071 Chest pain on breathing: Secondary | ICD-10-CM | POA: Insufficient documentation

## 2013-01-21 DIAGNOSIS — Y9389 Activity, other specified: Secondary | ICD-10-CM | POA: Insufficient documentation

## 2013-01-21 DIAGNOSIS — W108XXA Fall (on) (from) other stairs and steps, initial encounter: Secondary | ICD-10-CM | POA: Insufficient documentation

## 2013-01-21 DIAGNOSIS — F319 Bipolar disorder, unspecified: Secondary | ICD-10-CM | POA: Insufficient documentation

## 2013-01-21 DIAGNOSIS — M25559 Pain in unspecified hip: Secondary | ICD-10-CM | POA: Insufficient documentation

## 2013-01-21 DIAGNOSIS — J449 Chronic obstructive pulmonary disease, unspecified: Secondary | ICD-10-CM | POA: Insufficient documentation

## 2013-01-21 DIAGNOSIS — R269 Unspecified abnormalities of gait and mobility: Secondary | ICD-10-CM | POA: Insufficient documentation

## 2013-01-21 DIAGNOSIS — Y92009 Unspecified place in unspecified non-institutional (private) residence as the place of occurrence of the external cause: Secondary | ICD-10-CM | POA: Insufficient documentation

## 2013-01-21 DIAGNOSIS — E119 Type 2 diabetes mellitus without complications: Secondary | ICD-10-CM | POA: Insufficient documentation

## 2013-01-21 DIAGNOSIS — Z79899 Other long term (current) drug therapy: Secondary | ICD-10-CM | POA: Insufficient documentation

## 2013-01-21 DIAGNOSIS — J4489 Other specified chronic obstructive pulmonary disease: Secondary | ICD-10-CM | POA: Insufficient documentation

## 2013-01-21 DIAGNOSIS — S40029A Contusion of unspecified upper arm, initial encounter: Secondary | ICD-10-CM | POA: Insufficient documentation

## 2013-01-21 MED ORDER — HYDROMORPHONE HCL PF 2 MG/ML IJ SOLN
2.0000 mg | Freq: Once | INTRAMUSCULAR | Status: AC
  Administered 2013-01-21: 2 mg via INTRAMUSCULAR
  Filled 2013-01-21: qty 1

## 2013-01-21 MED ORDER — OXYCODONE HCL 5 MG PO TABS: 5.0000 mg | ORAL_TABLET | ORAL | Status: AC | PRN

## 2013-01-21 NOTE — ED Provider Notes (Signed)
CSN: [id number]     Arrival date & time 01/21/13  0130 History   First MD Initiated Contact with Patient 01/21/13 0150     Chief Complaint  Patient presents with  . Fall   (Consider location/radiation/quality/duration/timing/severity/associated sxs/prior Treatment) HPI 47 year old female with a history of back surgery causing a chronic difficulty walking. She was walking down some stairs about 5 PM yesterday when her right leg "gave out" as it often does. She states she tumbled down the back 6 stairs. She landed primarily on her left side. Since falling she has developed moderate to severe left-sided chest wall pain, worse with breathing or palpation. It is causing her to be short of breath. She also has pain over her left iliac crest that has bruising to her left upper arm and the left side of her face. She denies loss of consciousness. She denies nausea or vomiting. She has been ambulatory since the fall. She states she feels "sore all over".  Past Medical History  Diagnosis Date  . Hypertension   . Diabetes mellitus   . COPD (chronic obstructive pulmonary disease)   . Asthma   . Shortness of breath   . Headache(784.0)   . Arthritis   . Fibromyalgia   . Anxiety   . Diabetes mellitus 05/20/2011  . HTN (hypertension) 05/20/2011  . S/P diskectomy 05/20/2011  . Cirrhosis of liver   . Bipolar 1 disorder    Past Surgical History  Procedure Laterality Date  . Neck surgery    . Cesarean section    . Tonsillectomy    . Back surgery      x2  . Knee arthroscopy      L  . Lumbar laminectomy/decompression microdiscectomy  04/20/2011    Procedure: LUMBAR LAMINECTOMY/DECOMPRESSION MICRODISCECTOMY;  Surgeon: Karn Cassis, MD;  Location: [facility];  Service: Neurosurgery;  Laterality: Right;  Right Lumbar four- five Diskectomy  . Lumbar laminectomy/decompression microdiscectomy  05/08/2011    Procedure: LUMBAR LAMINECTOMY/DECOMPRESSION MICRODISCECTOMY 1 LEVEL;  Surgeon: Clydene Fake, MD;   Location: [facility];  Service: Neurosurgery;  Laterality: N/A;  Lumbar four-five, Redo Lumbar Laminectomy, Repair of CSF Leak  . Lumbar wound debridement  05/20/2011    Procedure: LUMBAR WOUND DEBRIDEMENT;  Surgeon: Karn Cassis, MD;  Location: [facility];  Service: Neurosurgery;  Laterality: N/A;  Incision and Drainage of Lumbar wound  . Lumbar wound debridement  06/15/2011    Procedure: LUMBAR WOUND DEBRIDEMENT;  Surgeon: Karn Cassis, MD;  Location: [facility];  Service: Neurosurgery;  Laterality: N/A;  Exploration of Lumbar wound   Family History  Problem Relation Age of Onset  . Hypertension Mother   . Lung cancer Father    History  Substance Use Topics  . Smoking status: Current Every Day Smoker -- 0.50 packs/day    Types: Cigarettes    Last Attempt to Quit: 03/26/2011  . Smokeless tobacco: Not on file  . Alcohol Use: No   OB History   Grav Para Term Preterm Abortions TAB SAB Ect Mult Living                 Review of Systems  All other systems reviewed and are negative.    Allergies  Codeine and Gabapentin  Home Medications   Current Outpatient Rx  Name  Route  Sig  Dispense  Refill  . amLODipine (NORVASC) 10 MG tablet   Oral   Take 10 mg by mouth daily.         Marland Kitchen  dextrose 5 % SOLN 50 mL with ceFEPIme 2 G SOLR 2 g   Intravenous   Inject 2 g into the vein every 12 (twelve) hours.   96 vial   0     End date May 26th.   . EXPIRED: metFORMIN (GLUCOPHAGE) 500 MG tablet   Oral   Take 1 tablet (500 mg total) by mouth 2 (two) times daily with a meal.         . oxyCODONE (ROXICODONE) 15 MG immediate release tablet   Oral   Take 15 mg by mouth every 4 (four) hours as needed. For pain         . risperiDONE (RISPERDAL) 1 MG tablet   Oral   Take 1-2 mg by mouth 2 (two) times daily. 1mg  in the morning; 2mg  in the evening         . sodium chloride 0.9 % SOLN 100 mL with vancomycin 1000 MG SOLR 2,000 mg   Intravenous   Inject 2,000 mg into the  vein every 12 (twelve) hours.   96 vial   0     End date May 26, check weekly CBC, BMP, & vanco tr ...   . sodium chloride 0.9 % SOLN 500 mL with vancomycin 1000 MG SOLR   Intravenous   Inject 1,500 mg into the vein every 8 (eight) hours.         Marland Kitchen sulfamethoxazole-trimethoprim (BACTRIM DS) 800-160 MG per tablet      TAKE 1 TABLET BY MOUTH 2 (TWO) TIMES DAILY.   60 tablet   10   . tiotropium (SPIRIVA) 18 MCG inhalation capsule   Inhalation   Place 18 mcg into inhaler and inhale daily.          BP 144/93  Pulse 116  Temp(Src) 98.2 F (36.8 C) (Oral)  Resp 22  SpO2 97%  LMP 11/28/2011  Physical Exam General: Well-developed, well-nourished female in no acute distress; appearance consistent with age of record HENT: normocephalic; ecchymosis to left cheek without bony deformity or crepitus Eyes: pupils equal, round and reactive to light; extraocular muscles intact Neck: supple; mild C-spine tenderness Heart: regular rate and rhythm Lungs: Rales in bases bilaterally Chest: Left-sided rib tenderness without deformity or crepitus Abdomen: soft; nondistended; left iliac crest tenderness; bowel sounds present Extremities: No deformity; full range of motion; pulses normal; ecchymosis left upper arm without associated bony deformity or crepitus; ecchymosis and tenderness overlying the left scapula without bony deformity or crepitus Neurologic: Awake, alert and oriented; motor function intact in all extremities and symmetric; no facial droop Skin: Warm and dry Psychiatric: Normal mood and affect    ED Course  Procedures (including critical care time)   MDM  Nursing notes and vitals signs, including pulse oximetry, reviewed.  Summary of this visit's results, reviewed by myself:  Imaging Studies: Dg Ribs Unilateral W/chest Left  01/21/2013   CLINICAL DATA:  History of trauma from a fall. Lower anterior left rib pain.  EXAM: LEFT RIBS AND CHEST - 3+ VIEW  COMPARISON:   CHEST x-ray 07/18/2012.  FINDINGS: Lung volumes are normal. No consolidative airspace disease. No pleural effusions. No pneumothorax. No pulmonary nodule or mass noted. Pulmonary vasculature and the cardiomediastinal silhouette are within normal limits.  Dedicated views of the left ribs demonstrate no acute displaced left-sided rib fractures.  IMPRESSION: 1. No acute displaced left-sided rib fractures. No radiographic evidence of acute cardiopulmonary disease.   Electronically Signed   By: Brayton Mars.D.  On: 01/21/2013 02:50   Dg Cervical Spine Complete  01/21/2013   CLINICAL DATA:  History of trauma from a fall complaining of posterior neck pain radiating down and to the left shoulder.  EXAM: CERVICAL SPINE  4+ VIEWS  COMPARISON:  No priors.  FINDINGS: Seven views of the cervical spine demonstrate no acute displaced cervical spine fractures. Alignment is anatomic. Prevertebral soft tissues are normal.  IMPRESSION: 1. No evidence of significant acute traumatic injury to the cervical spine.   Electronically Signed   By: Trudie Reed M.D.   On: 01/21/2013 02:51   Dg Pelvis 1-2 Views  01/21/2013   CLINICAL DATA:  History of trauma after falling down the steps. Pain in the coccyx area.  EXAM: PELVIS - 1-2 VIEW  COMPARISON:  Lumbar spine radiograph 09/10/2012.  FINDINGS: Single AP view of the pelvis demonstrates no definite acute displaced fractures of the bony pelvic ring. The sacrum and coccyx appear intact on this single view examination. Calcifications projecting over the central pelvis, similar to the prior lumbar spine radiograph 09/10/2012, likely of fibroid of origin.  IMPRESSION: 1. No acute radiographic abnormality of the bony pelvis. .   Electronically Signed   By: Trudie Reed M.D.   On: 01/21/2013 02:53        Hanley Seamen, MD 01/21/13 0302

## 2013-01-21 NOTE — ED Notes (Signed)
Pt fell down 6 wooden steps outside of family members house around 1500. Pt denies tripping and states that her "legs gave out on her". She states that she could catch herself. Around 1700 pt states that she felt like she was having a hard time breathing and states that it has gotten more difficult to breath with more time passing. Pt complains of generalized pain. Bruising noted to left side of face as well as left upper arm. Left rib cage with pain noted. Pt took hot shower and 2 otc aleive for pain and did fall asleep for unknown period. Pt is accompanied by boyfriend at bedside.

## 2014-07-15 ENCOUNTER — Other Ambulatory Visit: Payer: Self-pay | Admitting: Infectious Disease

## 2014-10-26 ENCOUNTER — Emergency Department (HOSPITAL_COMMUNITY): Payer: Medicaid Other

## 2014-10-26 ENCOUNTER — Emergency Department (HOSPITAL_COMMUNITY)
Admission: EM | Admit: 2014-10-26 | Discharge: 2014-10-26 | Disposition: A | Discharge status: Home / Self Care | Payer: Medicaid Other | Attending: Emergency Medicine | Admitting: Emergency Medicine

## 2014-10-26 ENCOUNTER — Encounter (HOSPITAL_COMMUNITY): Payer: Self-pay | Admitting: Emergency Medicine

## 2014-10-26 DIAGNOSIS — F1110 Opioid abuse, uncomplicated: Secondary | ICD-10-CM | POA: Diagnosis not present

## 2014-10-26 DIAGNOSIS — Y92410 Unspecified street and highway as the place of occurrence of the external cause: Secondary | ICD-10-CM | POA: Insufficient documentation

## 2014-10-26 DIAGNOSIS — J449 Chronic obstructive pulmonary disease, unspecified: Secondary | ICD-10-CM | POA: Diagnosis not present

## 2014-10-26 DIAGNOSIS — Z8719 Personal history of other diseases of the digestive system: Secondary | ICD-10-CM | POA: Diagnosis not present

## 2014-10-26 DIAGNOSIS — S3991XA Unspecified injury of abdomen, initial encounter: Secondary | ICD-10-CM | POA: Insufficient documentation

## 2014-10-26 DIAGNOSIS — Z8739 Personal history of other diseases of the musculoskeletal system and connective tissue: Secondary | ICD-10-CM | POA: Diagnosis not present

## 2014-10-26 DIAGNOSIS — Z720 Tobacco use: Secondary | ICD-10-CM | POA: Insufficient documentation

## 2014-10-26 DIAGNOSIS — Z8619 Personal history of other infectious and parasitic diseases: Secondary | ICD-10-CM | POA: Insufficient documentation

## 2014-10-26 DIAGNOSIS — G8929 Other chronic pain: Secondary | ICD-10-CM | POA: Insufficient documentation

## 2014-10-26 DIAGNOSIS — Y9389 Activity, other specified: Secondary | ICD-10-CM | POA: Diagnosis not present

## 2014-10-26 DIAGNOSIS — S299XXA Unspecified injury of thorax, initial encounter: Secondary | ICD-10-CM | POA: Insufficient documentation

## 2014-10-26 DIAGNOSIS — F1310 Sedative, hypnotic or anxiolytic abuse, uncomplicated: Secondary | ICD-10-CM | POA: Diagnosis not present

## 2014-10-26 DIAGNOSIS — S8992XA Unspecified injury of left lower leg, initial encounter: Secondary | ICD-10-CM | POA: Diagnosis present

## 2014-10-26 DIAGNOSIS — S8012XA Contusion of left lower leg, initial encounter: Secondary | ICD-10-CM | POA: Diagnosis not present

## 2014-10-26 DIAGNOSIS — E119 Type 2 diabetes mellitus without complications: Secondary | ICD-10-CM | POA: Insufficient documentation

## 2014-10-26 DIAGNOSIS — Z8659 Personal history of other mental and behavioral disorders: Secondary | ICD-10-CM | POA: Insufficient documentation

## 2014-10-26 DIAGNOSIS — Y998 Other external cause status: Secondary | ICD-10-CM | POA: Diagnosis not present

## 2014-10-26 DIAGNOSIS — I10 Essential (primary) hypertension: Secondary | ICD-10-CM | POA: Diagnosis not present

## 2014-10-26 DIAGNOSIS — M79605 Pain in left leg: Secondary | ICD-10-CM

## 2014-10-26 HISTORY — DX: Unspecified viral hepatitis C without hepatic coma: B19.20

## 2014-10-26 LAB — RAPID URINE DRUG SCREEN, HOSP PERFORMED
AMPHETAMINES: NOT DETECTED
BARBITURATES: NOT DETECTED
BENZODIAZEPINES: POSITIVE — AB
Cocaine: NOT DETECTED
Opiates: POSITIVE — AB
Tetrahydrocannabinol: NOT DETECTED

## 2014-10-26 LAB — I-STAT CHEM 8, ED
BUN: 11 mg/dL (ref 6–20)
CREATININE: 0.9 mg/dL (ref 0.44–1.00)
Calcium, Ion: 1.18 mmol/L (ref 1.12–1.23)
Chloride: 101 mmol/L (ref 101–111)
Glucose, Bld: 66 mg/dL (ref 65–99)
HCT: 44 % (ref 36.0–46.0)
Hemoglobin: 15 g/dL (ref 12.0–15.0)
Potassium: 3.6 mmol/L (ref 3.5–5.1)
SODIUM: 139 mmol/L (ref 135–145)
TCO2: 26 mmol/L (ref 0–100)

## 2014-10-26 LAB — PROTIME-INR
INR: 1.22 (ref 0.00–1.49)
Prothrombin Time: 15.5 seconds — ABNORMAL HIGH (ref 11.6–15.2)

## 2014-10-26 LAB — CBC
HCT: 40.8 % (ref 36.0–46.0)
Hemoglobin: 13.6 g/dL (ref 12.0–15.0)
MCH: 29.4 pg (ref 26.0–34.0)
MCHC: 33.3 g/dL (ref 30.0–36.0)
MCV: 88.3 fL (ref 78.0–100.0)
PLATELETS: 97 10*3/uL — AB (ref 150–400)
RBC: 4.62 MIL/uL (ref 3.87–5.11)
RDW: 15 % (ref 11.5–15.5)
WBC: 8.6 10*3/uL (ref 4.0–10.5)

## 2014-10-26 LAB — ETHANOL

## 2014-10-26 MED ORDER — IOHEXOL 300 MG/ML  SOLN
100.0000 mL | Freq: Once | INTRAMUSCULAR | Status: AC | PRN
  Administered 2014-10-26: 100 mL via INTRAVENOUS

## 2014-10-26 MED ORDER — MORPHINE SULFATE 4 MG/ML IJ SOLN
4.0000 mg | Freq: Once | INTRAMUSCULAR | Status: AC
  Administered 2014-10-26: 4 mg via INTRAVENOUS
  Filled 2014-10-26: qty 1
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
## 2014-10-26 NOTE — Discharge Instructions (Signed)
Contusion °A contusion is a deep bruise. Contusions are the result of an injury that caused bleeding under the skin. The contusion may turn blue, purple, or yellow. Minor injuries will give you a painless contusion, but more severe contusions may stay painful and swollen for a few weeks.  °CAUSES  °A contusion is usually caused by a blow, trauma, or direct force to an area of the body. °SYMPTOMS  °· Swelling and redness of the injured area. °· Bruising of the injured area. °· Tenderness and soreness of the injured area. °· Pain. °DIAGNOSIS  °The diagnosis can be made by taking a history and physical exam. An X-ray, CT scan, or MRI may be needed to determine if there were any associated injuries, such as fractures. °TREATMENT  °Specific treatment will depend on what area of the body was injured. In general, the best treatment for a contusion is resting, icing, elevating, and applying cold compresses to the injured area. Over-the-counter medicines may also be recommended for pain control. Ask your caregiver what the best treatment is for your contusion. °HOME CARE INSTRUCTIONS  °· Put ice on the injured area. °¨ Put ice in a plastic bag. °¨ Place a towel between your skin and the bag. °¨ Leave the ice on for 15-20 minutes, 3-4 times a day, or as directed by your health care provider. °· Only take over-the-counter or prescription medicines for pain, discomfort, or fever as directed by your caregiver. Your caregiver may recommend avoiding anti-inflammatory medicines (aspirin, ibuprofen, and naproxen) for 48 hours because these medicines may increase bruising. °· Rest the injured area. °· If possible, elevate the injured area to reduce swelling. °SEEK IMMEDIATE MEDICAL CARE IF:  °· You have increased bruising or swelling. °· You have pain that is getting worse. °· Your swelling or pain is not relieved with medicines. °MAKE SURE YOU:  °· Understand these instructions. °· Will watch your condition. °· Will get help right  away if you are not doing well or get worse. °Document Released: 12/15/2004 Document Revised: 03/12/2013 Document Reviewed: 01/10/2011 °[employer]® Patient Information ©2015 [employer]. This information is not intended to replace advice given to you by your health care provider. Make sure you discuss any questions you have with your health care provider. ° °

## 2014-10-26 NOTE — ED Notes (Signed)
Note: bruising to abdomen is from insulin shots.
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
## 2020-09-18 DEATH — deceased
